# Patient Record
Sex: Male | Born: 1955 | Race: White | Hispanic: No | Marital: Single | Smoking: Former smoker
Health system: Southern US, Community
[De-identification: ages and names within clinical notes are randomized; demographics above are authoritative.]

## PROBLEM LIST (undated history)

## (undated) DIAGNOSIS — M1990 Unspecified osteoarthritis, unspecified site: Secondary | ICD-10-CM

## (undated) DIAGNOSIS — F32A Depression, unspecified: Secondary | ICD-10-CM

## (undated) DIAGNOSIS — C787 Secondary malignant neoplasm of liver and intrahepatic bile duct: Secondary | ICD-10-CM

## (undated) DIAGNOSIS — M720 Palmar fascial fibromatosis [Dupuytren]: Secondary | ICD-10-CM

## (undated) DIAGNOSIS — T7840XA Allergy, unspecified, initial encounter: Secondary | ICD-10-CM

## (undated) DIAGNOSIS — C61 Malignant neoplasm of prostate: Secondary | ICD-10-CM

## (undated) DIAGNOSIS — K219 Gastro-esophageal reflux disease without esophagitis: Secondary | ICD-10-CM

## (undated) HISTORY — DX: Depression, unspecified: F32.A

## (undated) HISTORY — DX: Palmar fascial fibromatosis (dupuytren): M72.0

## (undated) HISTORY — DX: Malignant neoplasm of prostate: C61

## (undated) HISTORY — DX: Gastro-esophageal reflux disease without esophagitis: K21.9

## (undated) HISTORY — DX: Allergy, unspecified, initial encounter: T78.40XA

## (undated) HISTORY — DX: Unspecified osteoarthritis, unspecified site: M19.90

## (undated) HISTORY — DX: Secondary malignant neoplasm of liver and intrahepatic bile duct: C78.7

## (undated) MED FILL — Dexamethasone Sodium Phosphate Inj 100 MG/10ML: INTRAMUSCULAR | Qty: 1 | Status: AC

## (undated) MED FILL — Docetaxel Soln for IV Infusion 160 MG/16ML: INTRAVENOUS | Qty: 13 | Status: AC

---
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
## 2017-12-08 HISTORY — PX: PROSTATECTOMY: SHX69

## 2018-05-31 DIAGNOSIS — F17211 Nicotine dependence, cigarettes, in remission: Secondary | ICD-10-CM | POA: Diagnosis not present

## 2018-05-31 DIAGNOSIS — C61 Malignant neoplasm of prostate: Secondary | ICD-10-CM

## 2019-06-07 ENCOUNTER — Other Ambulatory Visit (HOSPITAL_COMMUNITY): Payer: Self-pay | Admitting: Unknown Physician Specialty

## 2019-06-07 ENCOUNTER — Other Ambulatory Visit (HOSPITAL_COMMUNITY): Payer: Self-pay | Admitting: Radiation Oncology

## 2019-06-07 DIAGNOSIS — C61 Malignant neoplasm of prostate: Secondary | ICD-10-CM

## 2019-06-14 ENCOUNTER — Encounter (HOSPITAL_COMMUNITY): Payer: Medicaid Other

## 2019-06-14 ENCOUNTER — Encounter (HOSPITAL_COMMUNITY): Admission: RE | Admit: 2019-06-14 | Payer: Medicaid Other | Source: Ambulatory Visit

## 2019-06-16 ENCOUNTER — Other Ambulatory Visit (HOSPITAL_COMMUNITY): Payer: Self-pay | Admitting: Diagnostic Radiology

## 2019-06-21 ENCOUNTER — Other Ambulatory Visit: Payer: Self-pay

## 2019-06-21 ENCOUNTER — Other Ambulatory Visit (HOSPITAL_COMMUNITY): Payer: Medicaid Other

## 2019-06-21 ENCOUNTER — Ambulatory Visit (HOSPITAL_COMMUNITY)
Admission: RE | Admit: 2019-06-21 | Discharge: 2019-06-21 | Disposition: A | Discharge status: Home / Self Care | Payer: Medicaid Other | Source: Ambulatory Visit | Attending: Radiation Oncology | Admitting: Radiation Oncology

## 2019-06-21 DIAGNOSIS — C61 Malignant neoplasm of prostate: Secondary | ICD-10-CM | POA: Diagnosis not present

## 2019-06-21 MED ORDER — AXUMIN (FLUCICLOVINE F 18) INJECTION
10.0800 | Freq: Once | INTRAVENOUS | Status: AC | PRN
  Administered 2019-06-21: 15:00:00 10.08 via INTRAVENOUS

## 2019-08-25 DIAGNOSIS — M79641 Pain in right hand: Secondary | ICD-10-CM | POA: Insufficient documentation

## 2019-08-25 DIAGNOSIS — M79642 Pain in left hand: Secondary | ICD-10-CM | POA: Insufficient documentation

## 2020-03-08 HISTORY — PX: HERNIA REPAIR: SHX51

## 2020-04-11 DIAGNOSIS — C61 Malignant neoplasm of prostate: Secondary | ICD-10-CM

## 2020-05-21 DIAGNOSIS — C61 Malignant neoplasm of prostate: Secondary | ICD-10-CM | POA: Diagnosis not present

## 2020-06-20 DIAGNOSIS — C61 Malignant neoplasm of prostate: Secondary | ICD-10-CM | POA: Diagnosis not present

## 2020-08-01 DIAGNOSIS — C61 Malignant neoplasm of prostate: Secondary | ICD-10-CM

## 2020-08-22 DIAGNOSIS — C787 Secondary malignant neoplasm of liver and intrahepatic bile duct: Secondary | ICD-10-CM | POA: Diagnosis not present

## 2020-08-22 DIAGNOSIS — C61 Malignant neoplasm of prostate: Secondary | ICD-10-CM

## 2020-08-26 ENCOUNTER — Encounter: Payer: Self-pay | Admitting: Oncology

## 2020-08-26 DIAGNOSIS — C61 Malignant neoplasm of prostate: Secondary | ICD-10-CM | POA: Insufficient documentation

## 2020-08-31 ENCOUNTER — Encounter: Payer: Self-pay | Admitting: Pharmacist

## 2020-08-31 DIAGNOSIS — C61 Malignant neoplasm of prostate: Secondary | ICD-10-CM

## 2020-09-11 ENCOUNTER — Other Ambulatory Visit: Payer: Self-pay | Admitting: Hematology and Oncology

## 2020-09-11 DIAGNOSIS — C61 Malignant neoplasm of prostate: Secondary | ICD-10-CM | POA: Diagnosis not present

## 2020-09-11 DIAGNOSIS — C787 Secondary malignant neoplasm of liver and intrahepatic bile duct: Secondary | ICD-10-CM

## 2020-09-11 LAB — BASIC METABOLIC PANEL
BUN: 13 (ref 4–21)
CO2: 25 — AB (ref 13–22)
Chloride: 106 (ref 99–108)
Creatinine: 0.8 (ref 0.6–1.3)
Glucose: 89
Potassium: 3.6 (ref 3.4–5.3)
Sodium: 140 (ref 137–147)

## 2020-09-11 LAB — HEPATIC FUNCTION PANEL
ALT: 22 (ref 10–40)
AST: 31 (ref 14–40)
Alkaline Phosphatase: 45 (ref 25–125)
Bilirubin, Total: 0.6

## 2020-09-11 LAB — COMPREHENSIVE METABOLIC PANEL
Albumin: 4.1 (ref 3.5–5.0)
Calcium: 8.6 — AB (ref 8.7–10.7)

## 2020-09-11 LAB — CBC AND DIFFERENTIAL
HCT: 33 — AB (ref 41–53)
Hemoglobin: 11.1 — AB (ref 13.5–17.5)
Neutrophils Absolute: 2
Platelets: 230 (ref 150–399)
WBC: 3.6

## 2020-09-11 LAB — CBC: RBC: 3.34 — AB (ref 3.87–5.11)

## 2020-09-12 ENCOUNTER — Inpatient Hospital Stay: Payer: Medicaid Other | Attending: Oncology

## 2020-09-12 ENCOUNTER — Other Ambulatory Visit: Payer: Self-pay

## 2020-09-12 VITALS — BP 137/95 | HR 72 | Temp 97.8°F | Resp 16 | Ht 67.0 in | Wt 157.2 lb

## 2020-09-12 DIAGNOSIS — Z5111 Encounter for antineoplastic chemotherapy: Secondary | ICD-10-CM | POA: Diagnosis not present

## 2020-09-12 DIAGNOSIS — Z5189 Encounter for other specified aftercare: Secondary | ICD-10-CM | POA: Diagnosis not present

## 2020-09-12 DIAGNOSIS — C787 Secondary malignant neoplasm of liver and intrahepatic bile duct: Secondary | ICD-10-CM | POA: Insufficient documentation

## 2020-09-12 DIAGNOSIS — C61 Malignant neoplasm of prostate: Secondary | ICD-10-CM | POA: Diagnosis present

## 2020-09-12 MED ORDER — ONDANSETRON HCL 4 MG/2ML IJ SOLN
8.0000 mg | Freq: Once | INTRAMUSCULAR | Status: AC
  Administered 2020-09-12: 8 mg via INTRAVENOUS

## 2020-09-12 MED ORDER — SODIUM CHLORIDE 0.9% FLUSH
10.0000 mL | INTRAVENOUS | Status: DC | PRN
  Administered 2020-09-12 (×2): 10 mL
  Filled 2020-09-12: qty 10

## 2020-09-12 MED ORDER — SODIUM CHLORIDE 0.9 % IV SOLN
75.0000 mg/m2 | Freq: Once | INTRAVENOUS | Status: AC
  Administered 2020-09-12: 130 mg via INTRAVENOUS
  Filled 2020-09-12: qty 13

## 2020-09-12 MED ORDER — HEPARIN SOD (PORK) LOCK FLUSH 100 UNIT/ML IV SOLN
500.0000 [IU] | Freq: Once | INTRAVENOUS | Status: AC | PRN
  Administered 2020-09-12: 500 [IU]
  Filled 2020-09-12: qty 5

## 2020-09-12 MED ORDER — SODIUM CHLORIDE 0.9 % IV SOLN
Freq: Once | INTRAVENOUS | Status: AC
  Filled 2020-09-12: qty 250

## 2020-09-12 MED ORDER — ONDANSETRON HCL 4 MG/2ML IJ SOLN
INTRAMUSCULAR | Status: AC
  Filled 2020-09-12: qty 4

## 2020-09-12 MED ORDER — SODIUM CHLORIDE 0.9 % IV SOLN
10.0000 mg | Freq: Once | INTRAVENOUS | Status: AC
  Administered 2020-09-12: 10 mg via INTRAVENOUS
  Filled 2020-09-12: qty 10

## 2020-09-12 NOTE — Patient Instructions (Signed)
[facility] Discharge Instructions for Patients Receiving Chemotherapy  Today you received the following chemotherapy agents Docetaxel  To help prevent nausea and vomiting after your treatment, we encourage you to take your nausea medication.   If you develop nausea and vomiting that is not controlled by your nausea medication, call the clinic.   BELOW ARE SYMPTOMS THAT SHOULD BE REPORTED IMMEDIATELY:  *FEVER GREATER THAN 100.5 F  *CHILLS WITH OR WITHOUT FEVER  NAUSEA AND VOMITING THAT IS NOT CONTROLLED WITH YOUR NAUSEA MEDICATION  *UNUSUAL SHORTNESS OF BREATH  *UNUSUAL BRUISING OR BLEEDING  TENDERNESS IN MOUTH AND THROAT WITH OR WITHOUT PRESENCE OF ULCERS  *URINARY PROBLEMS  *BOWEL PROBLEMS  UNUSUAL RASH Items with * indicate a potential emergency and should be followed up as soon as possible.  Feel free to call the clinic should you have any questions or concerns. The clinic phone number is [phone].  Please show the [facility] at check-in to the Emergency Department and triage nurse.

## 2020-09-14 ENCOUNTER — Inpatient Hospital Stay: Payer: Medicaid Other

## 2020-09-14 ENCOUNTER — Other Ambulatory Visit: Payer: Self-pay

## 2020-09-14 VITALS — BP 139/90 | HR 80 | Temp 97.8°F | Resp 18 | Ht 67.0 in | Wt 156.3 lb

## 2020-09-14 DIAGNOSIS — C61 Malignant neoplasm of prostate: Secondary | ICD-10-CM

## 2020-09-14 DIAGNOSIS — Z5111 Encounter for antineoplastic chemotherapy: Secondary | ICD-10-CM | POA: Diagnosis not present

## 2020-09-14 MED ORDER — PEGFILGRASTIM-BMEZ 6 MG/0.6ML ~~LOC~~ SOSY
PREFILLED_SYRINGE | SUBCUTANEOUS | Status: AC
  Filled 2020-09-14: qty 0.6

## 2020-09-14 MED ORDER — PEGFILGRASTIM-BMEZ 6 MG/0.6ML ~~LOC~~ SOSY
6.0000 mg | PREFILLED_SYRINGE | Freq: Once | SUBCUTANEOUS | Status: AC
  Administered 2020-09-14: 6 mg via SUBCUTANEOUS

## 2020-09-14 NOTE — Progress Notes (Signed)
Pt stable at time of discharge. 

## 2020-10-01 DIAGNOSIS — C61 Malignant neoplasm of prostate: Secondary | ICD-10-CM | POA: Diagnosis not present

## 2020-10-01 DIAGNOSIS — C787 Secondary malignant neoplasm of liver and intrahepatic bile duct: Secondary | ICD-10-CM

## 2020-10-01 LAB — BASIC METABOLIC PANEL
BUN: 14 (ref 4–21)
CO2: 27 — AB (ref 13–22)
Chloride: 103 (ref 99–108)
Creatinine: 1 (ref 0.6–1.3)
Glucose: 121
Potassium: 4.3 (ref 3.4–5.3)
Sodium: 138 (ref 137–147)

## 2020-10-01 LAB — HEPATIC FUNCTION PANEL
ALT: 23 (ref 10–40)
AST: 32 (ref 14–40)
Alkaline Phosphatase: 51 (ref 25–125)
Bilirubin, Total: 0.5

## 2020-10-01 LAB — CBC AND DIFFERENTIAL
HCT: 35 — AB (ref 41–53)
Hemoglobin: 11.8 — AB (ref 13.5–17.5)
Neutrophils Absolute: 4.24
Platelets: 224 (ref 150–399)
WBC: 5.3

## 2020-10-01 LAB — COMPREHENSIVE METABOLIC PANEL
Albumin: 4.5 (ref 3.5–5.0)
Calcium: 9.4 (ref 8.7–10.7)

## 2020-10-01 LAB — CBC: RBC: 3.51 — AB (ref 3.87–5.11)

## 2020-10-01 LAB — PSA: PSA: 1.8

## 2020-10-02 ENCOUNTER — Other Ambulatory Visit: Payer: Self-pay | Admitting: Hematology and Oncology

## 2020-10-02 LAB — MAGNESIUM: Magnesium: 1.8

## 2020-10-03 ENCOUNTER — Other Ambulatory Visit: Payer: Self-pay

## 2020-10-03 ENCOUNTER — Inpatient Hospital Stay: Payer: Medicaid Other

## 2020-10-03 VITALS — BP 174/79 | HR 93 | Resp 18 | Ht 67.0 in | Wt 154.2 lb

## 2020-10-03 DIAGNOSIS — C61 Malignant neoplasm of prostate: Secondary | ICD-10-CM

## 2020-10-03 DIAGNOSIS — Z5111 Encounter for antineoplastic chemotherapy: Secondary | ICD-10-CM | POA: Diagnosis not present

## 2020-10-03 MED ORDER — SODIUM CHLORIDE 0.9 % IV SOLN
10.0000 mg | Freq: Once | INTRAVENOUS | Status: AC
  Administered 2020-10-03: 10 mg via INTRAVENOUS
  Filled 2020-10-03: qty 10

## 2020-10-03 MED ORDER — SODIUM CHLORIDE 0.9 % IV SOLN
Freq: Once | INTRAVENOUS | Status: AC
  Filled 2020-10-03: qty 250

## 2020-10-03 MED ORDER — SODIUM CHLORIDE 0.9 % IV SOLN
75.0000 mg/m2 | Freq: Once | INTRAVENOUS | Status: AC
  Administered 2020-10-03: 130 mg via INTRAVENOUS
  Filled 2020-10-03: qty 13

## 2020-10-03 MED ORDER — ONDANSETRON HCL 4 MG/2ML IJ SOLN
8.0000 mg | Freq: Once | INTRAMUSCULAR | Status: AC
  Administered 2020-10-03: 8 mg via INTRAVENOUS

## 2020-10-03 MED ORDER — SODIUM CHLORIDE 0.9% FLUSH
10.0000 mL | INTRAVENOUS | Status: DC | PRN
  Administered 2020-10-03: 10 mL
  Filled 2020-10-03: qty 10

## 2020-10-03 MED ORDER — HEPARIN SOD (PORK) LOCK FLUSH 100 UNIT/ML IV SOLN
500.0000 [IU] | Freq: Once | INTRAVENOUS | Status: AC | PRN
  Administered 2020-10-03: 500 [IU]
  Filled 2020-10-03: qty 5

## 2020-10-03 MED ORDER — ONDANSETRON HCL 4 MG/2ML IJ SOLN
INTRAMUSCULAR | Status: AC
  Filled 2020-10-03: qty 4

## 2020-10-03 NOTE — Progress Notes (Signed)
PT STABLE AT TIME OF DISCHARGE 

## 2020-10-03 NOTE — Patient Instructions (Signed)
Docetaxel injection What is this medicine? DOCETAXEL (doe se TAX el) is a chemotherapy drug. It targets fast dividing cells, like cancer cells, and causes these cells to die. This medicine is used to treat many types of cancers like breast cancer, certain stomach cancers, head and neck cancer, lung cancer, and prostate cancer. This medicine may be used for other purposes; ask your health care provider or pharmacist if you have questions. COMMON BRAND NAME(S): Docefrez, Taxotere What should I tell my health care provider before I take this medicine? They need to know if you have any of these conditions:  infection (especially a virus infection such as chickenpox, cold sores, or herpes)  liver disease  low blood counts, like low white cell, platelet, or red cell counts  an unusual or allergic reaction to docetaxel, polysorbate 80, other chemotherapy agents, other medicines, foods, dyes, or preservatives  pregnant or trying to get pregnant  breast-feeding How should I use this medicine? This drug is given as an infusion into a vein. It is administered in a hospital or clinic by a specially trained health care professional. Talk to your pediatrician regarding the use of this medicine in children. Special care may be needed. Overdosage: If you think you have taken too much of this medicine contact a poison control center or emergency room at once. NOTE: This medicine is only for you. Do not share this medicine with others. What if I miss a dose? It is important not to miss your dose. Call your doctor or health care professional if you are unable to keep an appointment. What may interact with this medicine?  aprepitant  certain antibiotics like erythromycin or clarithromycin  certain antivirals for HIV or hepatitis  certain medicines for fungal infections like fluconazole, itraconazole, ketoconazole, posaconazole, or  voriconazole  cimetidine  ciprofloxacin  conivaptan  cyclosporine  dronedarone  fluvoxamine  grapefruit juice  imatinib  verapamil This list may not describe all possible interactions. Give your health care provider a list of all the medicines, herbs, non-prescription drugs, or dietary supplements you use. Also tell them if you smoke, drink alcohol, or use illegal drugs. Some items may interact with your medicine. What should I watch for while using this medicine? Your condition will be monitored carefully while you are receiving this medicine. You will need important blood work done while you are taking this medicine. Call your doctor or health care professional for advice if you get a fever, chills or sore throat, or other symptoms of a cold or flu. Do not treat yourself. This drug decreases your body's ability to fight infections. Try to avoid being around people who are sick. Some products may contain alcohol. Ask your health care professional if this medicine contains alcohol. Be sure to tell all health care professionals you are taking this medicine. Certain medicines, like metronidazole and disulfiram, can cause an unpleasant reaction when taken with alcohol. The reaction includes flushing, headache, nausea, vomiting, sweating, and increased thirst. The reaction can last from 30 minutes to several hours. You may get drowsy or dizzy. Do not drive, use machinery, or do anything that needs mental alertness until you know how this medicine affects you. Do not stand or sit up quickly, especially if you are an older patient. This reduces the risk of dizzy or fainting spells. Alcohol may interfere with the effect of this medicine. Talk to your health care professional about your risk of cancer. You may be more at risk for certain types of cancer if   you take this medicine. Do not become pregnant while taking this medicine or for 6 months after stopping it. Women should inform their doctor if  they wish to become pregnant or think they might be pregnant. There is a potential for serious side effects to an unborn child. Talk to your health care professional or pharmacist for more information. Do not breast-feed an infant while taking this medicine or for 1 week after stopping it. Males who get this medicine must use a condom during sex with females who can get pregnant. If you get a woman pregnant, the baby could have birth defects. The baby could die before they are born. You will need to continue wearing a condom for 3 months after stopping the medicine. Tell your health care provider right away if your partner becomes pregnant while you are taking this medicine. This may interfere with the ability to father a child. You should talk to your doctor or health care professional if you are concerned about your fertility. What side effects may I notice from receiving this medicine? Side effects that you should report to your doctor or health care professional as soon as possible:  allergic reactions like skin rash, itching or hives, swelling of the face, lips, or tongue  blurred vision  breathing problems  changes in vision  low blood counts - This drug may decrease the number of white blood cells, red blood cells and platelets. You may be at increased risk for infections and bleeding.  nausea and vomiting  pain, redness or irritation at site where injected  pain, tingling, numbness in the hands or feet  redness, blistering, peeling, or loosening of the skin, including inside the mouth  signs of decreased platelets or bleeding - bruising, pinpoint red spots on the skin, black, tarry stools, nosebleeds  signs of decreased red blood cells - unusually weak or tired, fainting spells, lightheadedness  signs of infection - fever or chills, cough, sore throat, pain or difficulty passing urine  swelling of the ankle, feet, hands Side effects that usually do not require medical attention  (report to your doctor or health care professional if they continue or are bothersome):  constipation  diarrhea  fingernail or toenail changes  hair loss  loss of appetite  mouth sores  muscle pain This list may not describe all possible side effects. Call your doctor for medical advice about side effects. You may report side effects to FDA at 1-800-FDA-1088. Where should I keep my medicine? This drug is given in a hospital or clinic and will not be stored at home. NOTE: This sheet is a summary. It may not cover all possible information. If you have questions about this medicine, talk to your doctor, pharmacist, or health care provider.  2020 Elsevier/Gold Standard (2019-07-21 10:19:06)  

## 2020-10-04 ENCOUNTER — Ambulatory Visit: Payer: Medicaid Other

## 2020-10-04 ENCOUNTER — Inpatient Hospital Stay: Payer: Medicaid Other

## 2020-10-05 ENCOUNTER — Inpatient Hospital Stay: Payer: Medicaid Other

## 2020-10-05 ENCOUNTER — Other Ambulatory Visit: Payer: Self-pay

## 2020-10-05 VITALS — BP 126/74 | HR 85 | Temp 98.0°F | Resp 18 | Ht 67.0 in | Wt 147.2 lb

## 2020-10-05 DIAGNOSIS — Z5111 Encounter for antineoplastic chemotherapy: Secondary | ICD-10-CM | POA: Diagnosis not present

## 2020-10-05 DIAGNOSIS — C61 Malignant neoplasm of prostate: Secondary | ICD-10-CM

## 2020-10-05 MED ORDER — PEGFILGRASTIM-BMEZ 6 MG/0.6ML ~~LOC~~ SOSY
PREFILLED_SYRINGE | SUBCUTANEOUS | Status: AC
  Filled 2020-10-05: qty 0.6

## 2020-10-05 MED ORDER — PEGFILGRASTIM-BMEZ 6 MG/0.6ML ~~LOC~~ SOSY
6.0000 mg | PREFILLED_SYRINGE | Freq: Once | SUBCUTANEOUS | Status: AC
  Administered 2020-10-05: 6 mg via SUBCUTANEOUS

## 2020-10-05 NOTE — Progress Notes (Signed)
PT STABLE AT TIME OF DISCHARGE 

## 2020-10-05 NOTE — Patient Instructions (Signed)
[facility] Discharge Instructions for Patients Receiving Chemotherapy  Today you received the following chemotherapy agents Ziextenzo  To help prevent nausea and vomiting after your treatment, we encourage you to take your nausea medication.   If you develop nausea and vomiting that is not controlled by your nausea medication, call the clinic.   BELOW ARE SYMPTOMS THAT SHOULD BE REPORTED IMMEDIATELY:  *FEVER GREATER THAN 100.5 F  *CHILLS WITH OR WITHOUT FEVER  NAUSEA AND VOMITING THAT IS NOT CONTROLLED WITH YOUR NAUSEA MEDICATION  *UNUSUAL SHORTNESS OF BREATH  *UNUSUAL BRUISING OR BLEEDING  TENDERNESS IN MOUTH AND THROAT WITH OR WITHOUT PRESENCE OF ULCERS  *URINARY PROBLEMS  *BOWEL PROBLEMS  UNUSUAL RASH Items with * indicate a potential emergency and should be followed up as soon as possible.  Feel free to call the clinic should you have any questions or concerns at The clinic phone number is [phone].  Please show the [facility] at check-in to the Emergency Department and triage nurse.

## 2020-10-09 ENCOUNTER — Telehealth: Payer: Self-pay

## 2020-10-09 NOTE — Telephone Encounter (Signed)
Pt called stating he had Neulasta injection on Friday and has been experiencing excrutiating back pain since.  Sent message to [place] pt to take pain med.  If worse than before, present to ED.

## 2020-10-18 ENCOUNTER — Other Ambulatory Visit: Payer: Self-pay | Admitting: Oncology

## 2020-10-22 ENCOUNTER — Inpatient Hospital Stay (INDEPENDENT_AMBULATORY_CARE_PROVIDER_SITE_OTHER): Payer: Medicaid Other | Admitting: Hematology and Oncology

## 2020-10-22 ENCOUNTER — Inpatient Hospital Stay: Payer: Medicaid Other | Attending: Oncology

## 2020-10-22 ENCOUNTER — Other Ambulatory Visit: Payer: Self-pay

## 2020-10-22 ENCOUNTER — Other Ambulatory Visit: Payer: Self-pay | Admitting: Hematology and Oncology

## 2020-10-22 ENCOUNTER — Encounter: Payer: Self-pay | Admitting: Hematology and Oncology

## 2020-10-22 VITALS — BP 143/70 | HR 64 | Temp 98.1°F | Resp 18 | Ht 67.0 in | Wt 149.3 lb

## 2020-10-22 DIAGNOSIS — D649 Anemia, unspecified: Secondary | ICD-10-CM | POA: Insufficient documentation

## 2020-10-22 DIAGNOSIS — Z23 Encounter for immunization: Secondary | ICD-10-CM | POA: Insufficient documentation

## 2020-10-22 DIAGNOSIS — C778 Secondary and unspecified malignant neoplasm of lymph nodes of multiple regions: Secondary | ICD-10-CM | POA: Insufficient documentation

## 2020-10-22 DIAGNOSIS — C787 Secondary malignant neoplasm of liver and intrahepatic bile duct: Secondary | ICD-10-CM

## 2020-10-22 DIAGNOSIS — Z5189 Encounter for other specified aftercare: Secondary | ICD-10-CM | POA: Diagnosis not present

## 2020-10-22 DIAGNOSIS — C61 Malignant neoplasm of prostate: Secondary | ICD-10-CM

## 2020-10-22 DIAGNOSIS — R911 Solitary pulmonary nodule: Secondary | ICD-10-CM | POA: Insufficient documentation

## 2020-10-22 DIAGNOSIS — Z5111 Encounter for antineoplastic chemotherapy: Secondary | ICD-10-CM | POA: Diagnosis present

## 2020-10-22 DIAGNOSIS — R200 Anesthesia of skin: Secondary | ICD-10-CM | POA: Insufficient documentation

## 2020-10-22 DIAGNOSIS — Z79899 Other long term (current) drug therapy: Secondary | ICD-10-CM | POA: Diagnosis not present

## 2020-10-22 LAB — CBC WITH DIFFERENTIAL (CANCER CENTER ONLY)
Abs Immature Granulocytes: 0.02 10*3/uL (ref 0.00–0.07)
Basophils Absolute: 0 10*3/uL (ref 0.0–0.1)
Basophils Relative: 1 %
Eosinophils Absolute: 0 10*3/uL (ref 0.0–0.5)
Eosinophils Relative: 0 %
HCT: 37.1 % — ABNORMAL LOW (ref 39.0–52.0)
Hemoglobin: 11.8 g/dL — ABNORMAL LOW (ref 13.0–17.0)
Immature Granulocytes: 1 %
Lymphocytes Relative: 30 %
Lymphs Abs: 1.1 10*3/uL (ref 0.7–4.0)
MCH: 33.3 pg (ref 26.0–34.0)
MCHC: 31.8 g/dL (ref 30.0–36.0)
MCV: 104.8 fL — ABNORMAL HIGH (ref 80.0–100.0)
Monocytes Absolute: 0.4 10*3/uL (ref 0.1–1.0)
Monocytes Relative: 12 %
Neutro Abs: 2.1 10*3/uL (ref 1.7–7.7)
Neutrophils Relative %: 56 %
Platelet Count: 229 10*3/uL (ref 150–400)
RBC: 3.54 MIL/uL — ABNORMAL LOW (ref 4.22–5.81)
RDW: 14.3 % (ref 11.5–15.5)
WBC Count: 3.7 10*3/uL — ABNORMAL LOW (ref 4.0–10.5)
nRBC: 0 % (ref 0.0–0.2)

## 2020-10-22 LAB — CMP (CANCER CENTER ONLY)
ALT: 23 U/L (ref 0–44)
AST: 21 U/L (ref 15–41)
Albumin: 4.7 g/dL (ref 3.5–5.0)
Alkaline Phosphatase: 44 U/L (ref 38–126)
Anion gap: 10 (ref 5–15)
BUN: 15 mg/dL (ref 8–23)
CO2: 27 mmol/L (ref 22–32)
Calcium: 9.1 mg/dL (ref 8.9–10.3)
Chloride: 104 mmol/L (ref 98–111)
Creatinine: 0.92 mg/dL (ref 0.61–1.24)
GFR, Estimated: >60 mL/min (ref >60)
Glucose, Bld: 100 mg/dL — ABNORMAL HIGH (ref 70–99)
Potassium: 3.3 mmol/L — ABNORMAL LOW (ref 3.5–5.1)
Sodium: 141 mmol/L (ref 135–145)
Total Bilirubin: 0.5 mg/dL (ref 0.3–1.2)
Total Protein: 7.4 g/dL (ref 6.5–8.1)

## 2020-10-22 NOTE — Progress Notes (Signed)
[facility]  [address] [place],  [place]  [address] [phone]  Clinic Day:  10/22/2020  Referring physician: Yvonne Kendall, NP   CHIEF COMPLAINT:  CC: Follow-up of prior to an 8th cycle of docetaxel in treatment of his metastatic prostate cancer  Current Treatment:   Docetaxel/prednisone, in addition to Lupron every 4 months.   HISTORY OF PRESENT ILLNESS:  Jeffrey Lambert is a 64 y.o. 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  He had symptoms of urinary frequency and a steadily rising PSA.  Biopsy confirmed prostatic adenocarcinoma with a Gleason 8, 4+ 4, in 100% of the biopsy submitted.  He also had cystoscopy at that time, which was benign.  He has had hematuria and and hematospermia in the past.  CT imaging in May revealed mild prostatic enlargement and a bone scan was negative for metastatic disease.  He underwent radical prostatectomy and bilateral pelvic lymphadenectomy in May 2019.  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  There was extraprostatic extension at bilateral apices and positive margins at the left posterior lateral wall.  There was also infiltration of the seminal vesicles.  The tumor volume was approximately 50% of the gland.  Bilateral lymph nodes were clear.  He had a weight loss of 80 lb in the perioperative period.  Due to his aggressive prostate cancer and family history of malignancy, he underwent testing for hereditary cancer syndromes with the [employer] myRisk Hereditary Cancer Panel.  This did not reveal any clinically significant mutation or variants of uncertain significance.  He was recommended for adjuvant radiation therapy.  He received 2720 cGy for 15 fractions and then stopped in August 2019.  By January 2020, he had 1 treatment and in May 2020 he went back on daily treatment and had the other 5220 cGy in 29 fractions for a total  cumulative dose of 7940 cGy.  PET scan in July 2020 revealed mild hypermetabolic activity with soft tissue thickening in the deep pelvis felt to be postsurgical in nature.  There was no evidence of nodal metastasis or distant metastasis.  CT abdomen and pelvis done in the emergency room August 2020 revealed two hypodense liver lesions, new compared to imaging from 2019, ranging from 16-25 mm, which were suspicious for liver metastases, but were PET negative the month prior.    He continued to follow with Dr. Saddie Lambert and remained on Lupron injections every 4 months. The PSA was up 26.1 in January.  Bicalutamide was added in March.  The PSA had decreased to 9.7 in April.  We began seeing him again in May 2021, and the PSA was stable at 9.6.  We recommended CT imaging and bone scan.  CT abdomen and pelvis on May 20th revealed interval progression of liver metastases with the largest measuring 3 cm.  Ultrasound guided biopsy from June 10th revealed metastatic carcinoma consistent with the patient's known primary prostatic cancer.  Whole body bone scan from May 20th was negative for metastatic disease, but revealed degenerative changes throughout the cervical spine, as well as within the bilateral shoulders and bilateral knees.  Due to his liver metastasis, we recommended palliative IV chemotherapy with docetaxel, in addition to prednisone 5 twice daily.  He had insomnia due to additional dexamethasone premedication prior to chemotherapy, so the afternoon dose of his dexamethasone was discontinued. He continued his hormonal therapy.  CT chest, abdomen and pelvis from September  7th revealed stable right lower lobe pulmonary nodules with no new or progressive findings.  There  Was interval decrease an size of the hepatic metastatic lesions.  The largest of these lesions previously measuring 30 mm, now measure 16 mm.  No new lesions or abdominal/pelvic lymphadenopathy was observed.  There were no findings of osseous  metastatic disease.   He was seen in the emergency department on October 11th for shortness of breath. A CT a chest at that time did not reveal any pulmonary embolism.  There was stable right lower lobe pulmonary nodules, as well as stable appearance of the known hepatic metastasis. On October 25th, his PSA had decreased to 1.8.  INTERVAL HISTORY:  Jeffrey Lambert is seen today prior to an 8th cycle of docetaxel. He continues prednisone 5 mg twice daily.  He is up to date on Lupron injection. He denies fevers or chills. He denies pain at this time, but states he does have pain associated with Neulasta. His appetite is good, but he states it decreases for several days after chemotherapy. His weight has decreased 2-1/2 pounds over last 3 weeks.  REVIEW OF SYSTEMS:  Review of Systems  Constitutional: Negative for appetite change, chills, fatigue and fever.  HENT:   Negative for mouth sores and sore throat.   Eyes: Negative for icterus.  Respiratory: Negative for chest tightness, hemoptysis and wheezing.   Cardiovascular: Negative for chest pain, leg swelling and palpitations.  Gastrointestinal: Negative for abdominal pain, blood in stool, constipation, diarrhea, nausea and vomiting.  Endocrine: Negative for hot flashes.  Genitourinary: Negative for difficulty urinating, dysuria, frequency and hematuria.   Musculoskeletal: Negative for arthralgias and myalgias.  Skin: Negative for itching, rash and wound.  Neurological: Positive for numbness (intermittent tingling of his fingers). Negative for dizziness, extremity weakness, headaches and light-headedness.  Hematological: Negative for adenopathy. Does not bruise/bleed easily.  Psychiatric/Behavioral: Negative for depression. The patient is not nervous/anxious.      VITALS:  Blood pressure (!) 143/70, pulse 64, temperature 98.1 F (36.7 C), temperature source Oral, resp. rate 18, height 5' 7" (1.702 m), weight 149 lb 5 oz (67.7 kg), SpO2 99 %.  Wt  Readings from Last 3 Encounters:  10/22/20 149 lb 5 oz (67.7 kg)  10/22/20 149 lb 5 oz (67.7 kg)  10/05/20 147 lb 4 oz (66.8 kg)    Body mass index is 23.39 kg/m.  Performance status (ECOG): 0 - Asymptomatic  PHYSICAL EXAM:  Physical Exam Vitals and nursing note reviewed.  Constitutional:      Appearance: Normal appearance. He is normal weight.  HENT:     Mouth/Throat:     Mouth: Mucous membranes are moist.     Pharynx: Oropharynx is clear.  Eyes:     General: No scleral icterus.    Conjunctiva/sclera: Conjunctivae normal.     Pupils: Pupils are equal, round, and reactive to light.  Cardiovascular:     Rate and Rhythm: Normal rate and regular rhythm.     Heart sounds: Normal heart sounds. No murmur heard.  No friction rub. No gallop.   Pulmonary:     Effort: Pulmonary effort is normal.     Breath sounds: Normal breath sounds. No wheezing, rhonchi or rales.  Abdominal:     General: Bowel sounds are normal. There is no distension.     Palpations: Abdomen is soft. There is no hepatomegaly, splenomegaly or mass.     Tenderness: There is no abdominal tenderness.  Musculoskeletal:  General: Normal range of motion.     Cervical back: Neck supple.  Skin:    General: Skin is warm and dry.     Comments: Nails are brittle with ridges.  Neurological:     General: No focal deficit present.     Mental Status: He is alert and oriented to person, place, and time.  Psychiatric:        Mood and Affect: Mood normal.        Behavior: Behavior normal.        Thought Content: Thought content normal.    Lymph nodes:   There is no cervical, clavicular, axillary or inguinal lymphadenopathy.   LABS:   CBC Latest Ref Rng & Units 10/22/2020 10/01/2020 09/11/2020  WBC 4.0 - 10.5 K/uL 3.7(L) 5.3 3.6  Hemoglobin 13.0 - 17.0 g/dL 11.8(L) 11.8(A) 11.1(A)  Hematocrit 39 - 52 % 37.1(L) 35(A) 33(A)  Platelets 150 - 400 K/uL 229 224 230   CMP Latest Ref Rng & Units 10/22/2020 10/01/2020  09/11/2020  Glucose 70 - 99 mg/dL 100(H) - -  BUN 8 - 23 mg/dL _0 Creatinine 0.61 - 1.24 mg/dL 0.92 1.0 0.8  Sodium 135 - 145 mmol/L 141 138 140  Potassium 3.5 - 5.1 mmol/L 3.3(L) 4.3 3.6  Chloride 98 - 111 mmol/L 104 103 106  CO2 22 - 32 mmol/L 27 27(A) 25(A)  Calcium 8.9 - 10.3 mg/dL 9.1 9.4 8.6(A)  Total Protein 6.5 - 8.1 g/dL 7.4 - -  Total Bilirubin 0.3 - 1.2 mg/dL 0.5 - -  Alkaline Phos 38 - 126 U/L 44 51 45  AST 15 - 41 U/L 21 32 31  ALT 0 - 44 U/L _1 No results found for: CEA1 / No results found for: CEA1 No results found for: PSA1 No results found for: GRJ071 No results found for: GRE479  No results found for: TOTALPROTELP, ALBUMINELP, A1GS, A2GS, BETS, BETA2SER, GAMS, MSPIKE, SPEI No results found for: TIBC, FERRITIN, IRONPCTSAT No results found for: LDH  STUDIES:  No results found.    HISTORY:   Past Medical History:  Diagnosis Date  . Malignant neoplasm of prostate ([place])   . 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 Secondary malignant neoplasm of liver [facility])     History reviewed. No pertinent surgical history.  History reviewed. No pertinent family history.  Social History:  reports that he has quit smoking. He has never used smokeless tobacco. He reports that he does not drink alcohol. No history on file for drug use.The patient is alone today.  Allergies: No Known Allergies  Current Medications: Current Outpatient Medications  Medication Sig Dispense Refill  . bicalutamide (CASODEX) 50 MG tablet Take 40 mg by mouth daily.    Marland Kitchen dexamethasone (DECADRON) 4 MG tablet Take 4 mg by mouth as directed.    . ondansetron (ZOFRAN) 4 MG tablet Take 4 mg by mouth every 8 (eight) hours as needed for nausea or vomiting.    Marland Kitchen oxyCODONE (OXY IR/ROXICODONE) 5 MG immediate release tablet Take 5 mg by mouth every 4 (four) hours as needed for severe pain.    . pantoprazole (PROTONIX) 40 MG tablet Take 40 mg by mouth daily.    . predniSONE  (DELTASONE) 5 MG tablet Take 5 mg by mouth 2 (two) times daily with a meal.    . prochlorperazine (COMPAZINE) 10 MG tablet Take 10 mg by mouth every 6 (six) hours as needed for nausea  or vomiting.    . traZODone (DESYREL) 50 MG tablet Take 50 mg by mouth at bedtime.     No current facility-administered medications for this visit.     ASSESSMENT & PLAN:   Assessment/Plan:  1.  Stage III prostate cancer diagnosed in April 2019 who was treated with radical prostatectomy but had positive margins and involvement of the seminal vesicles as well as a Gleason 9 score and lymphovascular invasion.  He was seen by Dr. Orlene Erm who recommended adjuvant radiation therapy, which he finally completed a total cumulative dose of 7940 cGy in September 2020.  He continues Lupron injections every 3-4 months and was placed on bicalutamide in March 2021.  His PSA from April had decreased from 26.1 to 9.7, and latest down to 2.4 in October.    2.  Liver metastases, diagnosed in May of 2021, confirmed by biopsy to be metastatic from his prostate cancer.  There are 4 lesions in total measuring from 0.6 cm to 3 cm.  He is now receiving IV chemotherapy with docetaxel and prednisone, and I do recommend that we continue with therapy, at least until his next CT scan. 3. Improving anemia, B12, folate and iron studies were normal.  He will proceed with a 7th cycle of docetaxel on Wednesday, and I do recommend that he continue with chemotherapy at this time.  We will see him back in 3 weeks with CBC, CMP and PSA prior to an 8th cycle of docetaxel.  In 6 weeks, we will see him back with CBC, CMP and CT abdomen and pelvis prior to a 9th cycle.  He verbalizes understanding of an agreement to the plan today.  He knows to call the office should any new questions or concerns arise.  The patient understands the plans discussed today and is in agreement with them.  The patient knows to contact our office if he has issues requiring  immediate clinical assessment.  Marvia Pickles, PA-C

## 2020-10-23 ENCOUNTER — Telehealth: Payer: Self-pay

## 2020-10-23 LAB — PROSTATE-SPECIFIC AG, SERUM (LABCORP): Prostate Specific Ag, Serum: 1.6 ng/mL (ref 0.0–4.0)

## 2020-10-23 NOTE — Telephone Encounter (Addendum)
-----   Message from Marvia Pickles, PA-C sent at 10/23/2020  8:10 AM EST ----- Please let him know PSA stable at 1.6. Thanks!     I spoke with Fritz Pickerel, pts partner. He will relay message to Sharon, as he is in backyard @ this time.

## 2020-10-23 NOTE — Telephone Encounter (Signed)
Message Received: Today Mosher, Thalia Bloodgood, PA-C  Dairl Ponder, RN Please let him know PSA stable at 1.6. Thanks!    Attempted call to pt, no answer.

## 2020-10-24 ENCOUNTER — Other Ambulatory Visit: Payer: Self-pay

## 2020-10-24 ENCOUNTER — Inpatient Hospital Stay: Payer: Medicaid Other

## 2020-10-24 VITALS — BP 140/78 | HR 60 | Temp 98.3°F | Resp 18 | Ht 67.0 in | Wt 150.8 lb

## 2020-10-24 DIAGNOSIS — Z23 Encounter for immunization: Secondary | ICD-10-CM

## 2020-10-24 DIAGNOSIS — Z5111 Encounter for antineoplastic chemotherapy: Secondary | ICD-10-CM | POA: Diagnosis not present

## 2020-10-24 DIAGNOSIS — C61 Malignant neoplasm of prostate: Secondary | ICD-10-CM

## 2020-10-24 MED ORDER — SODIUM CHLORIDE 0.9 % IV SOLN
Freq: Once | INTRAVENOUS | Status: AC
  Filled 2020-10-24: qty 250

## 2020-10-24 MED ORDER — INFLUENZA VAC SPLIT QUAD 0.5 ML IM SUSY
0.5000 mL | PREFILLED_SYRINGE | Freq: Once | INTRAMUSCULAR | Status: AC
  Administered 2020-10-24: 0.5 mL via INTRAMUSCULAR
  Filled 2020-10-24: qty 0.5

## 2020-10-24 MED ORDER — ONDANSETRON HCL 4 MG/2ML IJ SOLN
8.0000 mg | Freq: Once | INTRAMUSCULAR | Status: AC
  Administered 2020-10-24: 8 mg via INTRAVENOUS

## 2020-10-24 MED ORDER — HEPARIN SOD (PORK) LOCK FLUSH 100 UNIT/ML IV SOLN
500.0000 [IU] | Freq: Once | INTRAVENOUS | Status: AC | PRN
  Administered 2020-10-24: 500 [IU]
  Filled 2020-10-24: qty 5

## 2020-10-24 MED ORDER — SODIUM CHLORIDE 0.9 % IV SOLN
10.0000 mg | Freq: Once | INTRAVENOUS | Status: AC
  Administered 2020-10-24: 10 mg via INTRAVENOUS
  Filled 2020-10-24: qty 10

## 2020-10-24 MED ORDER — INFLUENZA VAC SPLIT QUAD 0.5 ML IM SUSY
PREFILLED_SYRINGE | INTRAMUSCULAR | Status: AC
  Filled 2020-10-24: qty 0.5

## 2020-10-24 MED ORDER — SODIUM CHLORIDE 0.9 % IV SOLN
75.0000 mg/m2 | Freq: Once | INTRAVENOUS | Status: AC
  Administered 2020-10-24: 130 mg via INTRAVENOUS
  Filled 2020-10-24: qty 13

## 2020-10-24 MED ORDER — ONDANSETRON HCL 4 MG/2ML IJ SOLN
INTRAMUSCULAR | Status: AC
  Filled 2020-10-24: qty 4

## 2020-10-24 NOTE — Patient Instructions (Signed)
[facility] Discharge Instructions for Patients Receiving Chemotherapy  Today you received the following chemotherapy agents DOCETAXEL  To help prevent nausea and vomiting after your treatment, we encourage you to take your nausea medication.   If you develop nausea and vomiting that is not controlled by your nausea medication, call the clinic.   BELOW ARE SYMPTOMS THAT SHOULD BE REPORTED IMMEDIATELY:  *FEVER GREATER THAN 100.5 F  *CHILLS WITH OR WITHOUT FEVER  NAUSEA AND VOMITING THAT IS NOT CONTROLLED WITH YOUR NAUSEA MEDICATION  *UNUSUAL SHORTNESS OF BREATH  *UNUSUAL BRUISING OR BLEEDING  TENDERNESS IN MOUTH AND THROAT WITH OR WITHOUT PRESENCE OF ULCERS  *URINARY PROBLEMS  *BOWEL PROBLEMS  UNUSUAL RASH Items with * indicate a potential emergency and should be followed up as soon as possible.  Feel free to call the clinic should you have any questions or concerns at The clinic phone number is [phone].  Please show the CHEMO ALERT CARD at check-in to the Emergency Department and triage nurse.   

## 2020-10-24 NOTE — Progress Notes (Signed)
PT STABLE AT TIME OF DISCHARGE 

## 2020-10-26 ENCOUNTER — Other Ambulatory Visit: Payer: Self-pay

## 2020-10-26 ENCOUNTER — Inpatient Hospital Stay: Payer: Medicaid Other

## 2020-10-26 VITALS — BP 148/84 | HR 99 | Temp 98.3°F | Resp 18 | Ht 67.0 in | Wt 150.2 lb

## 2020-10-26 DIAGNOSIS — C61 Malignant neoplasm of prostate: Secondary | ICD-10-CM

## 2020-10-26 DIAGNOSIS — Z5111 Encounter for antineoplastic chemotherapy: Secondary | ICD-10-CM | POA: Diagnosis not present

## 2020-10-26 MED ORDER — PEGFILGRASTIM-BMEZ 6 MG/0.6ML ~~LOC~~ SOSY
PREFILLED_SYRINGE | SUBCUTANEOUS | Status: AC
  Filled 2020-10-26: qty 0.6

## 2020-10-26 MED ORDER — PEGFILGRASTIM-BMEZ 6 MG/0.6ML ~~LOC~~ SOSY
6.0000 mg | PREFILLED_SYRINGE | Freq: Once | SUBCUTANEOUS | Status: AC
  Administered 2020-10-26: 6 mg via SUBCUTANEOUS

## 2020-10-26 NOTE — Progress Notes (Signed)
PT STABLE AT TIME OF DISCHARGE 

## 2020-10-26 NOTE — Patient Instructions (Signed)

## 2020-11-09 NOTE — Progress Notes (Signed)
[facility]  [address] [place],  [place]  [address] [phone]  Clinic Day:  11/12/2020  Referring physician: Reita May, NP   This document serves as a record of services personally performed by [name], MD. It was created on their behalf by [name],[name], a trained medical scribe. The creation of this record is based on the scribe's personal observations and the provider's statements to them.   CHIEF COMPLAINT:  CC: Follow-up of prior to an 8th cycle of docetaxel in treatment of his metastatic prostate cancer  Current Treatment:   Docetaxel/prednisone, in addition to Lupron every 4 months.   HISTORY OF PRESENT ILLNESS:  Jeffrey Lambert is a 64 y.o. 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  He had symptoms of urinary frequency and a steadily rising PSA.  Biopsy confirmed prostatic adenocarcinoma with a Gleason 8, 4+ 4, in 100% of the biopsy submitted.  He also had cystoscopy at that time, which was benign.  He has had hematuria and and hematospermia in the past.  CT imaging in May revealed mild prostatic enlargement and a bone scan was negative for metastatic disease.  He underwent radical prostatectomy and bilateral pelvic lymphadenectomy in May 2019.  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  There was extraprostatic extension at bilateral apices and positive margins at the left posterior lateral wall.  There was also infiltration of the seminal vesicles.  The tumor volume was approximately 50% of the gland.  Bilateral lymph nodes were clear.  He had a weight loss of 80 lb in the perioperative period.  Due to his aggressive prostate cancer and family history of malignancy, he underwent testing for hereditary cancer syndromes with the [employer] myRisk Hereditary Cancer Panel.  This did not reveal any clinically significant mutation or variants of  uncertain significance.  He was recommended for adjuvant radiation therapy.  He received 2720 cGy for 15 fractions and then stopped in August 2019.  By January 2020, he had 1 treatment and in May 2020 he went back on daily treatment and had the other 5220 cGy in 29 fractions for a total cumulative dose of 7940 cGy.  PET scan in July 2020 revealed mild hypermetabolic activity with soft tissue thickening in the deep pelvis felt to be postsurgical in nature.  There was no evidence of nodal metastasis or distant metastasis.  CT abdomen and pelvis done in the emergency room August 2020 revealed two hypodense liver lesions, new compared to imaging from 2019, ranging from 16-25 mm, which were suspicious for liver metastases, but were PET negative the month prior.    He continued to follow with Dr. Nila Nephew and remained on Lupron injections every 4 months. The PSA was up 26.1 in January.  Bicalutamide was added in March.  The PSA had decreased to 9.7 in April.  We began seeing him again in May 2021, and the PSA was stable at 9.6.  We recommended CT imaging and bone scan.  CT abdomen and pelvis on May 20th revealed interval progression of liver metastases with the largest measuring 3 cm.  Ultrasound guided biopsy from June 10th revealed metastatic carcinoma consistent with the patient's known primary prostatic cancer.  Whole body bone scan from May 20th was negative for metastatic disease, but revealed degenerative changes throughout the cervical spine, as well as within the bilateral shoulders and bilateral knees.  Due to his liver metastasis, we  recommended palliative IV chemotherapy with docetaxel, in addition to prednisone 5 twice daily.  He had insomnia due to additional dexamethasone premedication prior to chemotherapy, so the afternoon dose of his dexamethasone was discontinued. He continued his hormonal therapy.  CT chest, abdomen and pelvis from September 7th revealed stable right lower lobe pulmonary nodules with no  new or progressive findings.  There  Was interval decrease an size of the hepatic metastatic lesions.  The largest of these lesions previously measuring 30 mm, now measure 16 mm.  No new lesions or abdominal/pelvic lymphadenopathy was observed.  There were no findings of osseous metastatic disease.  On October 25th, his PSA had decreased to 1.8.  INTERVAL HISTORY:  He is here for follow up prior to a 9th cycle of docetaxel.  CT abdomen and pelvis from earlier today, December 6th revealed improvement in the hepatic metastasis with the largest lesion now measuring 1.7 cm, previously 1.9 cm.  No abdominopelvic adenopathy or evidence of extrahepatic metastatic disease was observed.  He states that he is well but does report taste changes from his chemotherapy.  He denies significant neuropathy symptoms.  His hemoglobin has mildly decreased from 11.8 to 11.5, and his white count and platelets are normal.  Chemistries are unremarkable except for a BUN of 21.  His  appetite is good, and he has gained 1 pound since his last visit.  He denies fever, chills or other signs of infection.  He denies nausea, vomiting, bowel issues, or abdominal pain.  He denies sore throat, cough, dyspnea, or chest pain.  REVIEW OF SYSTEMS:  Review of Systems  Constitutional:       Changes in taste  HENT:  Negative.   Eyes: Negative.   Respiratory: Negative.   Cardiovascular: Negative.   Gastrointestinal: Negative.   Endocrine: Negative.   Genitourinary: Negative.    Musculoskeletal: Negative.   Skin: Negative.   Neurological: Negative.   Hematological: Negative.   Psychiatric/Behavioral: Negative.   All other systems reviewed and are negative.    VITALS:  Blood pressure (!) 136/93, pulse 78, temperature 98.1 F (36.7 C), temperature source Oral, resp. rate 18, height 5' 7"  (1.702 m), weight 151 lb (68.5 kg), SpO2 98 %.  Wt Readings from Last 3 Encounters:  11/12/20 151 lb (68.5 kg)  10/26/20 150 lb 4 oz (68.2 kg)   10/24/20 150 lb 12 oz (68.4 kg)    Body mass index is 23.65 kg/m.  Performance status (ECOG): 1 - Symptomatic but completely ambulatory  PHYSICAL EXAM:  Physical Exam Constitutional:      General: He is not in acute distress.    Appearance: Normal appearance. He is normal weight.  HENT:     Head: Normocephalic and atraumatic.  Eyes:     General: No scleral icterus.    Extraocular Movements: Extraocular movements intact.     Conjunctiva/sclera: Conjunctivae normal.     Pupils: Pupils are equal, round, and reactive to light.  Cardiovascular:     Rate and Rhythm: Normal rate and regular rhythm.     Pulses: Normal pulses.     Heart sounds: Normal heart sounds. No murmur heard.  No friction rub. No gallop.   Pulmonary:     Effort: Pulmonary effort is normal. No respiratory distress.     Breath sounds: Normal breath sounds.  Abdominal:     General: Bowel sounds are normal. There is no distension.     Palpations: Abdomen is soft. There is no mass.  Tenderness: There is no abdominal tenderness.  Musculoskeletal:        General: Normal range of motion.     Cervical back: Normal range of motion and neck supple.     Right lower leg: No edema.     Left lower leg: No edema.  Lymphadenopathy:     Cervical: No cervical adenopathy.  Skin:    General: Skin is warm and dry.  Neurological:     General: No focal deficit present.     Mental Status: He is alert and oriented to person, place, and time. Mental status is at baseline.  Psychiatric:        Mood and Affect: Mood normal.        Behavior: Behavior normal.        Thought Content: Thought content normal.        Judgment: Judgment normal.    Lymph nodes:   There is no cervical, clavicular, axillary or inguinal lymphadenopathy.   LABS:   CBC Latest Ref Rng & Units 10/22/2020 10/01/2020 09/11/2020  WBC 4.0 - 10.5 K/uL 3.7(L) 5.3 3.6  Hemoglobin 13.0 - 17.0 g/dL 11.8(L) 11.8(A) 11.1(A)  Hematocrit 39 - 52 % 37.1(L) 35(A)  33(A)  Platelets 150 - 400 K/uL 229 224 230   CMP Latest Ref Rng & Units 10/22/2020 10/01/2020 09/11/2020  Glucose 70 - 99 mg/dL 100(H) - -  BUN 8 - 23 mg/dL 15 14 13   Creatinine 0.61 - 1.24 mg/dL 0.92 1.0 0.8  Sodium 135 - 145 mmol/L 141 138 140  Potassium 3.5 - 5.1 mmol/L 3.3(L) 4.3 3.6  Chloride 98 - 111 mmol/L 104 103 106  CO2 22 - 32 mmol/L 27 27(A) 25(A)  Calcium 8.9 - 10.3 mg/dL 9.1 9.4 8.6(A)  Total Protein 6.5 - 8.1 g/dL 7.4 - -  Total Bilirubin 0.3 - 1.2 mg/dL 0.5 - -  Alkaline Phos 38 - 126 U/L 44 51 45  AST 15 - 41 U/L 21 32 31  ALT 0 - 44 U/L 23 23 22       Lab Results  Component Value Date   PSA1 1.6 10/22/2020    STUDIES:  CT Abdomen Pelvis W Wo Contrast  Result Date: 11/12/2020 CLINICAL DATA:  Prostate cancer. Evaluate treatment response. Liver metastasis. EXAM: CT ABDOMEN AND PELVIS WITHOUT AND WITH CONTRAST TECHNIQUE: Multidetector CT imaging of the abdomen and pelvis was performed following the standard protocol before and following the bolus administration of intravenous contrast. CONTRAST:  193m OMNIPAQUE IOHEXOL 300 MG/ML  SOLN COMPARISON:  08/14/2020. FINDINGS: Lower chest: Clear lung bases. Normal heart size without pericardial or pleural effusion. Right coronary artery calcification. Tiny hiatal hernia. Hepatobiliary: 2 adjacent right hepatic lobe lesions are again identified. The more anterior measures 1.2 cm on 22/16 versus 1.6 cm on the prior. The more posterior measures 6 mm on the same image versus 8 mm on the prior. More inferior subcapsular right hepatic lobe lesion measures 1.7 cm on 30/16 versus 1.9 cm on the prior. A too small to characterize pericholecystic right liver lobe lesion on 43/6 may represent a cyst. Normal gallbladder, without biliary ductal dilatation. Pancreas: Normal, without mass or ductal dilatation. Spleen: Normal in size, without focal abnormality. Adrenals/Urinary Tract: Normal adrenal glands. Lower pole left renal subcentimeter cyst.  Normal right kidney. No hydronephrosis. Normal urinary bladder. Stomach/Bowel: Normal remainder of the stomach. Normal colon and terminal ileum. Normal small bowel. Vascular/Lymphatic: Aortic atherosclerosis. No abdominal adenopathy. Pelvic node dissection. No pelvic sidewall adenopathy. Reproductive: Prostatectomy, without locally  recurrent disease. Soft tissue thickening within the deep pelvis including on 70/16 has been present on multiple prior exams and could represent postoperative scarring or residual seminal vesicles. Other: Suspect prior right inguinal hernia repair. Musculoskeletal: Lumbosacral spondylosis. Prominent disc bulge at L3-4. IMPRESSION: 1. Improvement in hepatic metastasis. 2. No abdominopelvic adenopathy or evidence of extrahepatic metastatic disease. 3.  Tiny hiatal hernia. 4. Aortic atherosclerosis (ICD10-I70.0) and emphysema (ICD10-J43.9). Electronically Signed   By: Abigail Miyamoto M.D.   On: 11/12/2020 10:12      HISTORY:   Allergies:  Allergies  Allergen Reactions  . Other Other (See Comments)    Grass Pollen     Current Medications: Current Outpatient Medications  Medication Sig Dispense Refill  . bicalutamide (CASODEX) 50 MG tablet Take 40 mg by mouth daily.    Marland Kitchen dexamethasone (DECADRON) 4 MG tablet Take 4 mg by mouth as directed.    . ondansetron (ZOFRAN) 4 MG tablet Take 4 mg by mouth every 8 (eight) hours as needed for nausea or vomiting.    Marland Kitchen oxyCODONE (OXY IR/ROXICODONE) 5 MG immediate release tablet Take 5 mg by mouth every 4 (four) hours as needed for severe pain.    . pantoprazole (PROTONIX) 40 MG tablet pantoprazole 40 mg tablet,delayed release  TAKE 1 TABLET BY MOUTH TWICE DAILY WITH BREAKFAST AND EVENING MEAL    . predniSONE (DELTASONE) 5 MG tablet Take 5 mg by mouth 2 (two) times daily with a meal.    . prochlorperazine (COMPAZINE) 10 MG tablet Take 10 mg by mouth every 6 (six) hours as needed for nausea or vomiting.    . traZODone (DESYREL) 50 MG  tablet Take 50 mg by mouth at bedtime.     No current facility-administered medications for this visit.     ASSESSMENT & PLAN:   Assessment: 1.  Stage III prostate cancer diagnosed in April 2019 who was treated with radical prostatectomy but had positive margins and involvement of the seminal vesicles as well as a Gleason 9 score and lymphovascular invasion.  He was seen by Dr. Orlene Erm who recommended adjuvant radiation therapy, which he finally completed a total cumulative dose of 7940 cGy in September 2020.  He continues Lupron injections every 3-4 months and was placed on bicalutamide in March 2021.  His PSA from April had decreased from 26.1 to 9.7, and latest down to 2.4 in October.     2.  Liver metastases, diagnosed in May of 2021, confirmed by biopsy to be metastatic from his prostate cancer.  There are 4 lesions in total measuring from 0.6 cm to 3 cm.  He is now receiving IV chemotherapy with docetaxel and prednisone, and CT imaging has shown improvement.  I do recommend that we continue with therapy, at this time since he has minimal toxicity and it has been effective.  3. Improving anemia, B12, folate and iron studies were normal.  Plan: CT imaging has shown improvement in the hepatic metastasis with the largest lesion now measuring 1.7 cm, previously 1.9 cm. He will proceed with a 9th cycle of docetaxel on Wednesday, and I do recommend that he continue with chemotherapy at this time.  We will see him back in 3 weeks with CBC, CMP and PSA prior to a 10th cycle of docetaxel.  He verbalizes understanding of an agreement to the plan today.  He knows to call the office should any new questions or concerns arise.  The patient understands the plans discussed today and is in agreement with  them.  The patient knows to contact our office if he has issues requiring immediate clinical assessment.   Derwood Kaplan, MD [facility] AT [facility] [address] [place] [address] Dept: [phone] Dept Fax: [phone]   I, Rita Ohara, am acting as scribe for Derwood Kaplan, MD  I have reviewed this report as typed by the medical scribe, and it is complete and accurate.

## 2020-11-12 ENCOUNTER — Other Ambulatory Visit: Payer: Self-pay | Admitting: Hematology and Oncology

## 2020-11-12 ENCOUNTER — Other Ambulatory Visit: Payer: Self-pay

## 2020-11-12 ENCOUNTER — Other Ambulatory Visit: Payer: Self-pay | Admitting: Oncology

## 2020-11-12 ENCOUNTER — Inpatient Hospital Stay: Payer: Medicaid Other | Attending: Oncology | Admitting: Oncology

## 2020-11-12 ENCOUNTER — Inpatient Hospital Stay: Payer: Medicaid Other

## 2020-11-12 ENCOUNTER — Encounter: Payer: Self-pay | Admitting: Oncology

## 2020-11-12 ENCOUNTER — Ambulatory Visit (HOSPITAL_COMMUNITY)
Admission: RE | Admit: 2020-11-12 | Discharge: 2020-11-12 | Disposition: A | Discharge status: Home / Self Care | Payer: Medicaid Other | Source: Ambulatory Visit | Attending: Hematology and Oncology | Admitting: Hematology and Oncology

## 2020-11-12 ENCOUNTER — Telehealth: Payer: Self-pay | Admitting: Oncology

## 2020-11-12 VITALS — BP 136/93 | HR 78 | Temp 98.1°F | Resp 18 | Ht 67.0 in | Wt 151.0 lb

## 2020-11-12 DIAGNOSIS — C61 Malignant neoplasm of prostate: Secondary | ICD-10-CM

## 2020-11-12 DIAGNOSIS — Z79899 Other long term (current) drug therapy: Secondary | ICD-10-CM | POA: Insufficient documentation

## 2020-11-12 DIAGNOSIS — D630 Anemia in neoplastic disease: Secondary | ICD-10-CM

## 2020-11-12 DIAGNOSIS — C787 Secondary malignant neoplasm of liver and intrahepatic bile duct: Secondary | ICD-10-CM

## 2020-11-12 DIAGNOSIS — I700 Atherosclerosis of aorta: Secondary | ICD-10-CM | POA: Insufficient documentation

## 2020-11-12 DIAGNOSIS — M47817 Spondylosis without myelopathy or radiculopathy, lumbosacral region: Secondary | ICD-10-CM | POA: Insufficient documentation

## 2020-11-12 DIAGNOSIS — T380X5A Adverse effect of glucocorticoids and synthetic analogues, initial encounter: Secondary | ICD-10-CM | POA: Insufficient documentation

## 2020-11-12 DIAGNOSIS — G720 Drug-induced myopathy: Secondary | ICD-10-CM | POA: Insufficient documentation

## 2020-11-12 DIAGNOSIS — J439 Emphysema, unspecified: Secondary | ICD-10-CM | POA: Insufficient documentation

## 2020-11-12 DIAGNOSIS — Z5111 Encounter for antineoplastic chemotherapy: Secondary | ICD-10-CM | POA: Insufficient documentation

## 2020-11-12 DIAGNOSIS — D649 Anemia, unspecified: Secondary | ICD-10-CM | POA: Insufficient documentation

## 2020-11-12 DIAGNOSIS — M6281 Muscle weakness (generalized): Secondary | ICD-10-CM | POA: Insufficient documentation

## 2020-11-12 DIAGNOSIS — Z5189 Encounter for other specified aftercare: Secondary | ICD-10-CM | POA: Insufficient documentation

## 2020-11-12 LAB — BASIC METABOLIC PANEL
BUN: 21 (ref 4–21)
CO2: 25 — AB (ref 13–22)
Chloride: 105 (ref 99–108)
Creatinine: 0.9 (ref 0.6–1.3)
Glucose: 126
Potassium: 4.4 (ref 3.4–5.3)
Sodium: 139 (ref 137–147)

## 2020-11-12 LAB — CBC AND DIFFERENTIAL
HCT: 34 — AB (ref 41–53)
Hemoglobin: 11.5 — AB (ref 13.5–17.5)
Neutrophils Absolute: 4.6
Platelets: 237 (ref 150–399)
WBC: 5.9

## 2020-11-12 LAB — HEPATIC FUNCTION PANEL
ALT: 22 (ref 10–40)
AST: 31 (ref 14–40)
Alkaline Phosphatase: 46 (ref 25–125)
Bilirubin, Total: 0.6

## 2020-11-12 LAB — COMPREHENSIVE METABOLIC PANEL
Albumin: 4.5 (ref 3.5–5.0)
Calcium: 9 (ref 8.7–10.7)

## 2020-11-12 LAB — CBC: RBC: 3.42 — AB (ref 3.87–5.11)

## 2020-11-12 MED ORDER — IOHEXOL 300 MG/ML  SOLN
100.0000 mL | Freq: Once | INTRAMUSCULAR | Status: AC | PRN
  Administered 2020-11-12: 100 mL via INTRAVENOUS

## 2020-11-12 NOTE — Telephone Encounter (Signed)
Per 12/6 los next appt given to patient

## 2020-11-14 ENCOUNTER — Inpatient Hospital Stay: Payer: Medicaid Other

## 2020-11-14 ENCOUNTER — Other Ambulatory Visit: Payer: Self-pay

## 2020-11-14 VITALS — BP 157/72 | HR 68 | Temp 97.6°F | Resp 18 | Ht 67.0 in | Wt 151.2 lb

## 2020-11-14 DIAGNOSIS — C787 Secondary malignant neoplasm of liver and intrahepatic bile duct: Secondary | ICD-10-CM | POA: Diagnosis not present

## 2020-11-14 DIAGNOSIS — C61 Malignant neoplasm of prostate: Secondary | ICD-10-CM | POA: Diagnosis present

## 2020-11-14 DIAGNOSIS — I700 Atherosclerosis of aorta: Secondary | ICD-10-CM | POA: Diagnosis not present

## 2020-11-14 DIAGNOSIS — T380X5A Adverse effect of glucocorticoids and synthetic analogues, initial encounter: Secondary | ICD-10-CM | POA: Diagnosis not present

## 2020-11-14 DIAGNOSIS — Z5189 Encounter for other specified aftercare: Secondary | ICD-10-CM | POA: Diagnosis not present

## 2020-11-14 DIAGNOSIS — Z5111 Encounter for antineoplastic chemotherapy: Secondary | ICD-10-CM | POA: Diagnosis present

## 2020-11-14 DIAGNOSIS — J439 Emphysema, unspecified: Secondary | ICD-10-CM | POA: Diagnosis not present

## 2020-11-14 DIAGNOSIS — M47817 Spondylosis without myelopathy or radiculopathy, lumbosacral region: Secondary | ICD-10-CM | POA: Diagnosis not present

## 2020-11-14 DIAGNOSIS — Z79899 Other long term (current) drug therapy: Secondary | ICD-10-CM | POA: Diagnosis not present

## 2020-11-14 DIAGNOSIS — M6281 Muscle weakness (generalized): Secondary | ICD-10-CM | POA: Diagnosis not present

## 2020-11-14 DIAGNOSIS — D649 Anemia, unspecified: Secondary | ICD-10-CM | POA: Diagnosis not present

## 2020-11-14 DIAGNOSIS — G720 Drug-induced myopathy: Secondary | ICD-10-CM | POA: Diagnosis not present

## 2020-11-14 MED ORDER — SODIUM CHLORIDE 0.9 % IV SOLN
Freq: Once | INTRAVENOUS | Status: AC
  Filled 2020-11-14: qty 250

## 2020-11-14 MED ORDER — HEPARIN SOD (PORK) LOCK FLUSH 100 UNIT/ML IV SOLN
500.0000 [IU] | Freq: Once | INTRAVENOUS | Status: AC | PRN
  Administered 2020-11-14: 500 [IU]
  Filled 2020-11-14: qty 5

## 2020-11-14 MED ORDER — ZOLEDRONIC ACID 4 MG/100ML IV SOLN
INTRAVENOUS | Status: AC
  Filled 2020-11-14: qty 200

## 2020-11-14 MED ORDER — ONDANSETRON HCL 4 MG/2ML IJ SOLN
8.0000 mg | Freq: Once | INTRAMUSCULAR | Status: AC
  Administered 2020-11-14: 8 mg via INTRAVENOUS

## 2020-11-14 MED ORDER — SODIUM CHLORIDE 0.9 % IV SOLN
75.0000 mg/m2 | Freq: Once | INTRAVENOUS | Status: AC
  Administered 2020-11-14: 130 mg via INTRAVENOUS
  Filled 2020-11-14: qty 13

## 2020-11-14 MED ORDER — SODIUM CHLORIDE 0.9 % IV SOLN
10.0000 mg | Freq: Once | INTRAVENOUS | Status: AC
  Administered 2020-11-14: 10 mg via INTRAVENOUS
  Filled 2020-11-14: qty 10

## 2020-11-14 MED ORDER — ONDANSETRON HCL 4 MG/2ML IJ SOLN
INTRAMUSCULAR | Status: AC
  Filled 2020-11-14: qty 4

## 2020-11-14 NOTE — Progress Notes (Signed)
PT STABLE AT TIME OF DISCHARGE 

## 2020-11-14 NOTE — Patient Instructions (Signed)
Docetaxel injection What is this medicine? DOCETAXEL (doe se TAX el) is a chemotherapy drug. It targets fast dividing cells, like cancer cells, and causes these cells to die. This medicine is used to treat many types of cancers like breast cancer, certain stomach cancers, head and neck cancer, lung cancer, and prostate cancer. This medicine may be used for other purposes; ask your health care provider or pharmacist if you have questions. COMMON BRAND NAME(S): Docefrez, Taxotere What should I tell my health care provider before I take this medicine? They need to know if you have any of these conditions:  infection (especially a virus infection such as chickenpox, cold sores, or herpes)  liver disease  low blood counts, like low white cell, platelet, or red cell counts  an unusual or allergic reaction to docetaxel, polysorbate 80, other chemotherapy agents, other medicines, foods, dyes, or preservatives  pregnant or trying to get pregnant  breast-feeding How should I use this medicine? This drug is given as an infusion into a vein. It is administered in a hospital or clinic by a specially trained health care professional. Talk to your pediatrician regarding the use of this medicine in children. Special care may be needed. Overdosage: If you think you have taken too much of this medicine contact a poison control center or emergency room at once. NOTE: This medicine is only for you. Do not share this medicine with others. What if I miss a dose? It is important not to miss your dose. Call your doctor or health care professional if you are unable to keep an appointment. What may interact with this medicine?  aprepitant  certain antibiotics like erythromycin or clarithromycin  certain antivirals for HIV or hepatitis  certain medicines for fungal infections like fluconazole, itraconazole, ketoconazole, posaconazole, or  voriconazole  cimetidine  ciprofloxacin  conivaptan  cyclosporine  dronedarone  fluvoxamine  grapefruit juice  imatinib  verapamil This list may not describe all possible interactions. Give your health care provider a list of all the medicines, herbs, non-prescription drugs, or dietary supplements you use. Also tell them if you smoke, drink alcohol, or use illegal drugs. Some items may interact with your medicine. What should I watch for while using this medicine? Your condition will be monitored carefully while you are receiving this medicine. You will need important blood work done while you are taking this medicine. Call your doctor or health care professional for advice if you get a fever, chills or sore throat, or other symptoms of a cold or flu. Do not treat yourself. This drug decreases your body's ability to fight infections. Try to avoid being around people who are sick. Some products may contain alcohol. Ask your health care professional if this medicine contains alcohol. Be sure to tell all health care professionals you are taking this medicine. Certain medicines, like metronidazole and disulfiram, can cause an unpleasant reaction when taken with alcohol. The reaction includes flushing, headache, nausea, vomiting, sweating, and increased thirst. The reaction can last from 30 minutes to several hours. You may get drowsy or dizzy. Do not drive, use machinery, or do anything that needs mental alertness until you know how this medicine affects you. Do not stand or sit up quickly, especially if you are an older patient. This reduces the risk of dizzy or fainting spells. Alcohol may interfere with the effect of this medicine. Talk to your health care professional about your risk of cancer. You may be more at risk for certain types of cancer if   you take this medicine. Do not become pregnant while taking this medicine or for 6 months after stopping it. Women should inform their doctor if  they wish to become pregnant or think they might be pregnant. There is a potential for serious side effects to an unborn child. Talk to your health care professional or pharmacist for more information. Do not breast-feed an infant while taking this medicine or for 1 week after stopping it. Males who get this medicine must use a condom during sex with females who can get pregnant. If you get a woman pregnant, the baby could have birth defects. The baby could die before they are born. You will need to continue wearing a condom for 3 months after stopping the medicine. Tell your health care provider right away if your partner becomes pregnant while you are taking this medicine. This may interfere with the ability to father a child. You should talk to your doctor or health care professional if you are concerned about your fertility. What side effects may I notice from receiving this medicine? Side effects that you should report to your doctor or health care professional as soon as possible:  allergic reactions like skin rash, itching or hives, swelling of the face, lips, or tongue  blurred vision  breathing problems  changes in vision  low blood counts - This drug may decrease the number of white blood cells, red blood cells and platelets. You may be at increased risk for infections and bleeding.  nausea and vomiting  pain, redness or irritation at site where injected  pain, tingling, numbness in the hands or feet  redness, blistering, peeling, or loosening of the skin, including inside the mouth  signs of decreased platelets or bleeding - bruising, pinpoint red spots on the skin, black, tarry stools, nosebleeds  signs of decreased red blood cells - unusually weak or tired, fainting spells, lightheadedness  signs of infection - fever or chills, cough, sore throat, pain or difficulty passing urine  swelling of the ankle, feet, hands Side effects that usually do not require medical attention  (report to your doctor or health care professional if they continue or are bothersome):  constipation  diarrhea  fingernail or toenail changes  hair loss  loss of appetite  mouth sores  muscle pain This list may not describe all possible side effects. Call your doctor for medical advice about side effects. You may report side effects to FDA at 1-800-FDA-1088. Where should I keep my medicine? This drug is given in a hospital or clinic and will not be stored at home. NOTE: This sheet is a summary. It may not cover all possible information. If you have questions about this medicine, talk to your doctor, pharmacist, or health care provider.  2020 Elsevier/Gold Standard (2019-07-21 10:19:06)  

## 2020-11-16 ENCOUNTER — Inpatient Hospital Stay: Payer: Medicaid Other

## 2020-11-16 ENCOUNTER — Other Ambulatory Visit: Payer: Self-pay

## 2020-11-16 VITALS — BP 143/74 | HR 75 | Temp 98.1°F | Resp 18 | Ht 67.0 in | Wt 151.2 lb

## 2020-11-16 DIAGNOSIS — Z5111 Encounter for antineoplastic chemotherapy: Secondary | ICD-10-CM | POA: Diagnosis not present

## 2020-11-16 DIAGNOSIS — C61 Malignant neoplasm of prostate: Secondary | ICD-10-CM

## 2020-11-16 MED ORDER — PEGFILGRASTIM-BMEZ 6 MG/0.6ML ~~LOC~~ SOSY
6.0000 mg | PREFILLED_SYRINGE | Freq: Once | SUBCUTANEOUS | Status: AC
  Administered 2020-11-16: 6 mg via SUBCUTANEOUS

## 2020-11-16 MED ORDER — PEGFILGRASTIM-BMEZ 6 MG/0.6ML ~~LOC~~ SOSY
PREFILLED_SYRINGE | SUBCUTANEOUS | Status: AC
  Filled 2020-11-16: qty 0.6

## 2020-11-16 NOTE — Progress Notes (Signed)
PT STABLE AT TIME OF DISCHARGE 

## 2020-11-16 NOTE — Patient Instructions (Signed)

## 2020-11-26 NOTE — Progress Notes (Signed)
PT STABLE AT TIME OF DISCHARGE 

## 2020-12-03 ENCOUNTER — Other Ambulatory Visit: Payer: Self-pay | Admitting: Hematology and Oncology

## 2020-12-03 ENCOUNTER — Other Ambulatory Visit: Payer: Self-pay

## 2020-12-03 ENCOUNTER — Inpatient Hospital Stay (INDEPENDENT_AMBULATORY_CARE_PROVIDER_SITE_OTHER): Payer: Medicaid Other | Admitting: Oncology

## 2020-12-03 ENCOUNTER — Other Ambulatory Visit: Payer: Self-pay | Admitting: Oncology

## 2020-12-03 ENCOUNTER — Encounter: Payer: Self-pay | Admitting: Oncology

## 2020-12-03 ENCOUNTER — Inpatient Hospital Stay: Payer: Medicaid Other

## 2020-12-03 VITALS — BP 156/85 | HR 81 | Temp 98.1°F | Resp 18 | Ht 67.0 in | Wt 148.0 lb

## 2020-12-03 DIAGNOSIS — G722 Myopathy due to other toxic agents: Secondary | ICD-10-CM | POA: Diagnosis not present

## 2020-12-03 DIAGNOSIS — D649 Anemia, unspecified: Secondary | ICD-10-CM

## 2020-12-03 DIAGNOSIS — C61 Malignant neoplasm of prostate: Secondary | ICD-10-CM

## 2020-12-03 DIAGNOSIS — C787 Secondary malignant neoplasm of liver and intrahepatic bile duct: Secondary | ICD-10-CM

## 2020-12-03 DIAGNOSIS — Z5111 Encounter for antineoplastic chemotherapy: Secondary | ICD-10-CM | POA: Diagnosis not present

## 2020-12-03 DIAGNOSIS — Z79899 Other long term (current) drug therapy: Secondary | ICD-10-CM

## 2020-12-03 LAB — BASIC METABOLIC PANEL
BUN: 13 (ref 4–21)
CO2: 27 — AB (ref 13–22)
Chloride: 101 (ref 99–108)
Creatinine: 0.9 (ref 0.6–1.3)
Glucose: 105
Potassium: 3.7 (ref 3.4–5.3)
Sodium: 138 (ref 137–147)

## 2020-12-03 LAB — HEPATIC FUNCTION PANEL
ALT: 20 (ref 10–40)
AST: 30 (ref 14–40)
Alkaline Phosphatase: 56 (ref 25–125)
Bilirubin, Total: 0.6

## 2020-12-03 LAB — COMPREHENSIVE METABOLIC PANEL
Albumin: 4.3 (ref 3.5–5.0)
Calcium: 9.1 (ref 8.7–10.7)

## 2020-12-03 LAB — CBC: RBC: 3.63 — AB (ref 3.87–5.11)

## 2020-12-03 LAB — CBC AND DIFFERENTIAL
HCT: 36 — AB (ref 41–53)
Hemoglobin: 12.2 — AB (ref 13.5–17.5)
Neutrophils Absolute: 3.76
Platelets: 212 (ref 150–399)
WBC: 5.3

## 2020-12-03 LAB — PSA: Prostatic Specific Antigen: 0.73 ng/mL (ref 0.00–4.00)

## 2020-12-03 NOTE — Progress Notes (Signed)
[facility]  [address] [place],  [place]  [address] [phone]  Clinic Day:  12/03/2020  Referring physician: Reita May, NP   This document serves as a record of services personally performed by [name], MD. It was created on their behalf by [name],[name], a trained medical scribe. The creation of this record is based on the scribe's personal observations and the provider's statements to them.   CHIEF COMPLAINT:  CC: Follow-up of prior to an 8th cycle of docetaxel in treatment of his metastatic prostate cancer  Current Treatment:   Docetaxel/prednisone, in addition to Lupron every 4 months.   HISTORY OF PRESENT ILLNESS:  Jeffrey Lambert is a 65 y.o. 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  He had symptoms of urinary frequency and a steadily rising PSA.  Biopsy confirmed prostatic adenocarcinoma with a Gleason 8, 4+ 4, in 100% of the biopsy submitted.  He also had cystoscopy at that time, which was benign.  He has had hematuria and and hematospermia in the past.  CT imaging in May revealed mild prostatic enlargement and a bone scan was negative for metastatic disease.  He underwent radical prostatectomy and bilateral pelvic lymphadenectomy in May 2019.  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  There was extraprostatic extension at bilateral apices and positive margins at the left posterior lateral wall.  There was also infiltration of the seminal vesicles.  The tumor volume was approximately 50% of the gland.  Bilateral lymph nodes were clear.  He had a weight loss of 80 lb in the perioperative period.  Due to his aggressive prostate cancer and family history of malignancy, he underwent testing for hereditary cancer syndromes with the Myriad myRisk Hereditary Cancer Panel.  This did not reveal any clinically significant mutation or variants of  uncertain significance.  He was recommended for adjuvant radiation therapy.  He received 2720 cGy for 15 fractions and then stopped in August 2019.  By January 2020, he had 1 treatment and in May 2020 he went back on daily treatment and had the other 5220 cGy in 29 fractions for a total cumulative dose of 7940 cGy.  PET scan in July 2020 revealed mild hypermetabolic activity with soft tissue thickening in the deep pelvis felt to be postsurgical in nature.  There was no evidence of nodal metastasis or distant metastasis.  CT abdomen and pelvis done in the emergency room August 2020 revealed two hypodense liver lesions, new compared to imaging from 2019, ranging from 16-25 mm, which were suspicious for liver metastases, but were PET negative the month prior.    He continued to follow with Dr. Nila Nephew and remained on Lupron injections every 4 months. The PSA was up 26.1 in January.  Bicalutamide was added in March.  The PSA had decreased to 9.7 in April.  We began seeing him again in May 2021, and the PSA was stable at 9.6.  We recommended CT imaging and bone scan.  CT abdomen and pelvis on May 20th revealed interval progression of liver metastases with the largest measuring 3 cm.  Ultrasound guided biopsy from June 10th revealed metastatic carcinoma consistent with the patient's known primary prostatic cancer.  Whole body bone scan from May 20th was negative for metastatic disease, but revealed degenerative changes throughout the cervical spine, as well as within the bilateral shoulders and bilateral knees.  Due to his liver metastasis, we  recommended palliative IV chemotherapy with docetaxel, in addition to prednisone 5 twice daily.  He had insomnia due to additional dexamethasone premedication prior to chemotherapy, so the afternoon dose of his dexamethasone was discontinued. He continued his hormonal therapy.  CT chest, abdomen and pelvis from September 7th revealed stable right lower lobe pulmonary nodules with no  new or progressive findings.  There  Was interval decrease an size of the hepatic metastatic lesions.  The largest of these lesions previously measuring 30 mm, now measure 16 mm.  No new lesions or abdominal/pelvic lymphadenopathy was observed.  There were no findings of osseous metastatic disease.  On October 25th, his PSA had decreased to 1.8, and down to 1.6 in November.  CT abdomen and pelvis from December 6th revealed improvement in the hepatic metastasis with the largest lesion now measuring 1.7 cm, previously 1.9 cm.  No abdominopelvic adenopathy or evidence of extrahepatic metastatic disease was observed.    INTERVAL HISTORY:  He is here for follow up prior to a 10th cycle of docetaxel.  He states that he has been well but notes extremity fatigue even with mild exertion.  He describes weakness of the muscles of the proximal legs and proximal upper extremities.  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  His taste is still affected after chemotherapy.  His hemoglobin has increased from 11.5 to 12.2, and his white count and platelets are normal.  Chemistries are unremarkable.  His  appetite is good, and he has lost 3 pounds since his last visit.  He denies fever, chills or other signs of infection.  He denies nausea, vomiting, bowel issues, or abdominal pain.  He denies sore throat, cough, dyspnea, or chest pain.  REVIEW OF SYSTEMS:  Review of Systems  Constitutional: Negative.   HENT:  Negative.   Eyes: Negative.   Respiratory: Negative.   Cardiovascular: Negative.   Gastrointestinal: Negative.   Endocrine: Negative.   Genitourinary: Negative.    Musculoskeletal: Negative.   Skin: Negative.   Neurological: Negative.   Hematological: Negative.   Psychiatric/Behavioral: Negative.   All other systems reviewed and are negative.    VITALS:  Blood pressure (!) 156/85, pulse 81, temperature 98.1 F (36.7 C), temperature  source Oral, resp. rate 18, height _0  (1.702 m), weight 148 lb (67.1 kg), SpO2 98 %.  Wt Readings from Last 3 Encounters:  12/03/20 148 lb (67.1 kg)  10/01/20 152 lb 2 oz (69 kg)  10/01/20 152 lb 2 oz (69 kg)    Body mass index is 23.18 kg/m.  Performance status (ECOG): 1 - Symptomatic but completely ambulatory  PHYSICAL EXAM:  Physical Exam Constitutional:      General: He is not in acute distress.    Appearance: Normal appearance. He is normal weight.  HENT:     Head: Normocephalic and atraumatic.  Eyes:     General: No scleral icterus.    Extraocular Movements: Extraocular movements intact.     Conjunctiva/sclera: Conjunctivae normal.     Pupils: Pupils are equal, round, and reactive to light.  Cardiovascular:     Rate and Rhythm: Normal rate and regular rhythm.     Pulses: Normal pulses.     Heart sounds: Normal heart sounds. No murmur heard. No friction rub. No gallop.   Pulmonary:     Effort: Pulmonary effort is normal. No respiratory distress.     Breath sounds: Normal breath sounds.  Abdominal:  General: Bowel sounds are normal. There is no distension.     Palpations: Abdomen is soft. There is no hepatomegaly, splenomegaly or mass.     Tenderness: There is no abdominal tenderness.  Musculoskeletal:        General: Normal range of motion.     Cervical back: Normal range of motion and neck supple.     Right lower leg: No edema.     Left lower leg: No edema.  Lymphadenopathy:     Cervical: No cervical adenopathy.  Skin:    General: Skin is warm and dry.  Neurological:     General: No focal deficit present.     Mental Status: He is alert and oriented to person, place, and time. Mental status is at baseline.  Psychiatric:        Mood and Affect: Mood normal.        Behavior: Behavior normal.        Thought Content: Thought content normal.        Judgment: Judgment normal.    Lymph nodes:   There is no cervical, clavicular, axillary or inguinal  lymphadenopathy.   LABS:   CBC Latest Ref Rng & Units 11/12/2020 10/22/2020 10/01/2020  WBC - 5.9 3.7(L) 5.3  Hemoglobin 13.5 - 17.5 11.5(A) 11.8(L) 11.8(A)  Hematocrit 41 - 53 34(A) 37.1(L) 35(A)  Platelets 150 - 399 237 229 224   CMP Latest Ref Rng & Units 11/12/2020 10/22/2020 10/01/2020  Glucose 70 - 99 mg/dL - 100(H) -  BUN 4 - _0 Creatinine 0.6 - 1.3 0.9 0.92 1.0  Sodium 137 - 147 139 141 138  Potassium 3.4 - 5.3 4.4 3.3(L) 4.3  Chloride 99 - 108 105 104 103  CO2 13 - 22 25(A) 27 27(A)  Calcium 8.7 - 10.7 9.0 9.1 9.4  Total Protein 6.5 - 8.1 g/dL - 7.4 -  Total Bilirubin 0.3 - 1.2 mg/dL - 0.5 -  Alkaline Phos 25 - 125 46 44 51  AST 14 - 40 31 21 32  ALT 10 - 40 _1 Lab Results  Component Value Date   PSA1 1.6 10/22/2020    STUDIES:   No current studies  HISTORY:   Allergies:  Allergies  Allergen Reactions  . Other Other (See Comments)    Grass Pollen     Current Medications: Current Outpatient Medications  Medication Sig Dispense Refill  . bicalutamide (CASODEX) 50 MG tablet Take 40 mg by mouth daily.    Marland Kitchen dexamethasone (DECADRON) 4 MG tablet Take 4 mg by mouth as directed.    . ondansetron (ZOFRAN) 4 MG tablet Take 4 mg by mouth every 8 (eight) hours as needed for nausea or vomiting.    Marland Kitchen oxyCODONE (OXY IR/ROXICODONE) 5 MG immediate release tablet Take 5 mg by mouth every 4 (four) hours as needed for severe pain.    . pantoprazole (PROTONIX) 40 MG tablet pantoprazole 40 mg tablet,delayed release  TAKE 1 TABLET BY MOUTH TWICE DAILY WITH BREAKFAST AND EVENING MEAL    . predniSONE (DELTASONE) 5 MG tablet Take 5 mg by mouth 2 (two) times daily with a meal.    . prochlorperazine (COMPAZINE) 10 MG tablet Take 10 mg by mouth every 6 (six) hours as needed for nausea or vomiting.    . temazepam (RESTORIL) 15 MG capsule Take 15 mg by mouth at bedtime as needed for sleep.    . traZODone (DESYREL) 50 MG tablet  Take 50 mg by mouth at bedtime.      No current facility-administered medications for this visit.     ASSESSMENT & PLAN:   Assessment: 1.  Stage III prostate cancer diagnosed in April 2019 who was treated with radical prostatectomy but had positive margins and involvement of the seminal vesicles as well as a Gleason 9 score and lymphovascular invasion.  He was seen by Dr. Orlene Erm who recommended adjuvant radiation therapy, which he finally completed a total cumulative dose of 7940 cGy in September 2020.  He continues Lupron injections every 3-4 months and was placed on bicalutamide in March 2021.  His PSA from April had decreased from 26.1 to 9.7, down to 2.4 in October, and 1.6 in November.     2.  Liver metastases, diagnosed in May of 2021, confirmed by biopsy to be metastatic from his prostate cancer.  There are 4 lesions in total measuring from 0.6 cm to 3 cm.  He is now receiving IV chemotherapy with docetaxel and prednisone, and CT imaging has shown improvement in all 3 lesions and the 4th one may represent a cyst.  I do recommend that we continue with therapy, at this time since he has minimal toxicity and it has been effective.  3. Improving anemia, B12, folate and iron studies were normal.  4.  Evidence of steroid myopathy.  I advised that he decrease the prednisone to 5 mg daily.  Plan: He will proceed with a 10th cycle of docetaxel on Wednesday, and I do recommend that he continue with chemotherapy at this time.  He knows to decrease his prednisone to 5 mg daily.  We will see him back in 3 weeks with CBC and CMP prior to a 11th cycle of docetaxel.  We will plan for repeat CT imaging in late February or early March.  He verbalizes understanding of an agreement to the plan today.  He knows to call the office should any new questions or concerns arise.  The patient understands the plans discussed today and is in agreement with them.  The patient knows to contact our office if he has issues requiring immediate clinical  assessment.   Derwood Kaplan, MD [facility] [address] [place] [address] Dept: [phone] Dept Fax: [phone]   I, Rita Ohara, am acting as scribe for Derwood Kaplan, MD  I have reviewed this report as typed by the medical scribe, and it is complete and accurate.

## 2020-12-03 NOTE — Telephone Encounter (Signed)
Medication change 

## 2020-12-05 ENCOUNTER — Inpatient Hospital Stay: Payer: Medicaid Other

## 2020-12-05 ENCOUNTER — Other Ambulatory Visit: Payer: Self-pay

## 2020-12-05 VITALS — BP 132/88 | HR 88 | Temp 98.3°F | Resp 18 | Ht 67.0 in | Wt 147.0 lb

## 2020-12-05 DIAGNOSIS — Z5111 Encounter for antineoplastic chemotherapy: Secondary | ICD-10-CM | POA: Diagnosis not present

## 2020-12-05 DIAGNOSIS — C61 Malignant neoplasm of prostate: Secondary | ICD-10-CM

## 2020-12-05 MED ORDER — SODIUM CHLORIDE 0.9 % IV SOLN
Freq: Once | INTRAVENOUS | Status: AC
Start: 2020-12-05 — End: 2020-12-05
  Filled 2020-12-05: qty 250

## 2020-12-05 MED ORDER — ONDANSETRON HCL 4 MG/2ML IJ SOLN
8.0000 mg | Freq: Once | INTRAMUSCULAR | Status: AC
  Administered 2020-12-05: 8 mg via INTRAVENOUS

## 2020-12-05 MED ORDER — HEPARIN SOD (PORK) LOCK FLUSH 100 UNIT/ML IV SOLN
500.0000 [IU] | Freq: Once | INTRAVENOUS | Status: AC | PRN
  Administered 2020-12-05: 500 [IU]
  Filled 2020-12-05: qty 5

## 2020-12-05 MED ORDER — ONDANSETRON HCL 4 MG/2ML IJ SOLN
INTRAMUSCULAR | Status: AC
  Filled 2020-12-05: qty 4

## 2020-12-05 MED ORDER — DEXAMETHASONE SODIUM PHOSPHATE 100 MG/10ML IJ SOLN
10.0000 mg | Freq: Once | INTRAMUSCULAR | Status: AC
  Administered 2020-12-05: 10 mg via INTRAVENOUS
  Filled 2020-12-05: qty 10

## 2020-12-05 MED ORDER — SODIUM CHLORIDE 0.9 % IV SOLN
75.0000 mg/m2 | Freq: Once | INTRAVENOUS | Status: AC
  Administered 2020-12-05: 130 mg via INTRAVENOUS
  Filled 2020-12-05: qty 13

## 2020-12-05 NOTE — Patient Instructions (Signed)
[facility] Discharge Instructions for Patients Receiving Chemotherapy  Today you received the following chemotherapy agents docetaxel  To help prevent nausea and vomiting after your treatment, we encourage you to take your nausea medication.   If you develop nausea and vomiting that is not controlled by your nausea medication, call the clinic.   BELOW ARE SYMPTOMS THAT SHOULD BE REPORTED IMMEDIATELY:  *FEVER GREATER THAN 100.5 F  *CHILLS WITH OR WITHOUT FEVER  NAUSEA AND VOMITING THAT IS NOT CONTROLLED WITH YOUR NAUSEA MEDICATION  *UNUSUAL SHORTNESS OF BREATH  *UNUSUAL BRUISING OR BLEEDING  TENDERNESS IN MOUTH AND THROAT WITH OR WITHOUT PRESENCE OF ULCERS  *URINARY PROBLEMS  *BOWEL PROBLEMS  UNUSUAL RASH Items with * indicate a potential emergency and should be followed up as soon as possible.  Feel free to call the clinic should you have any questions or concerns at The clinic phone number is [phone].  Please show the CHEMO ALERT CARD at check-in to the Emergency Department and triage nurse.

## 2020-12-06 ENCOUNTER — Inpatient Hospital Stay: Payer: Medicaid Other

## 2020-12-06 ENCOUNTER — Other Ambulatory Visit: Payer: Self-pay | Admitting: Oncology

## 2020-12-06 ENCOUNTER — Ambulatory Visit: Payer: Medicaid Other

## 2020-12-06 VITALS — BP 138/78 | HR 81 | Temp 98.1°F | Resp 18 | Ht 67.0 in | Wt 147.0 lb

## 2020-12-06 DIAGNOSIS — C61 Malignant neoplasm of prostate: Secondary | ICD-10-CM

## 2020-12-06 DIAGNOSIS — Z5111 Encounter for antineoplastic chemotherapy: Secondary | ICD-10-CM | POA: Diagnosis not present

## 2020-12-06 MED ORDER — PEGFILGRASTIM-BMEZ 6 MG/0.6ML ~~LOC~~ SOSY
6.0000 mg | PREFILLED_SYRINGE | Freq: Once | SUBCUTANEOUS | Status: AC
  Administered 2020-12-06: 6 mg via SUBCUTANEOUS

## 2020-12-06 MED ORDER — PEGFILGRASTIM-BMEZ 6 MG/0.6ML ~~LOC~~ SOSY
PREFILLED_SYRINGE | SUBCUTANEOUS | Status: AC
  Filled 2020-12-06: qty 0.6

## 2020-12-06 NOTE — Progress Notes (Signed)
PT STABLE AT TIME OF DISCHARGE 

## 2020-12-06 NOTE — Patient Instructions (Signed)

## 2020-12-24 ENCOUNTER — Ambulatory Visit: Payer: Medicaid Other | Admitting: Hematology and Oncology

## 2020-12-24 ENCOUNTER — Other Ambulatory Visit: Payer: Self-pay | Admitting: Oncology

## 2020-12-24 ENCOUNTER — Other Ambulatory Visit: Payer: Medicaid Other

## 2020-12-24 ENCOUNTER — Inpatient Hospital Stay: Payer: Medicaid Other | Admitting: Hematology and Oncology

## 2020-12-24 ENCOUNTER — Inpatient Hospital Stay: Payer: Medicaid Other

## 2020-12-24 NOTE — Progress Notes (Deleted)
[facility]  [address] [place],  [place]  [address] [phone]  Clinic Day:  12/24/2020  Referring physician: Reita May, NP   CHIEF COMPLAINT:  CC: Metastatic prostate cancer  Current Treatment:   Docetaxel/prednisone with leuprolide every 4 months.  HISTORY OF PRESENT ILLNESS:  Jeffrey Lambert is a 65 y.o. 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  He had symptoms of urinary frequency and a steadily rising PSA.  Biopsy confirmed prostatic adenocarcinoma with a Gleason 8, 4+ 4, in 100% of the biopsy submitted.  He also had cystoscopy at that time, which was benign.  He has had hematuria and and hematospermia in the past.  CT imaging in May revealed mild prostatic enlargement and a bone scan was negative for metastatic disease.  He underwent radical prostatectomy and bilateral pelvic lymphadenectomy in May 2019.  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  There was extraprostatic extension at bilateral apices and positive margins at the left posterior lateral wall.  There was also infiltration of the seminal vesicles.  The tumor volume was approximately 50% of the gland.  Bilateral lymph nodes were clear.  He had a weight loss of 80 lb in the perioperative period.  Due to his aggressive prostate cancer and family history of malignancy, he underwent testing for hereditary cancer syndromes with the Myriad myRisk Hereditary Cancer Panel.  This did not reveal any clinically significant mutation or variants of uncertain significance.  He was recommended for adjuvant radiation therapy.  He received 2720 cGy for 15 fractions and then stopped in August 2019.  By January 2020, he had 1 treatment and in May 2020 he went back on daily treatment and had the other 5220 cGy in 29 fractions for a total cumulative dose of 7940 cGy.  PET scan in July 2020 revealed mild hypermetabolic  activity with soft tissue thickening in the deep pelvis felt to be postsurgical in nature.  There was no evidence of nodal metastasis or distant metastasis.  CT abdomen and pelvis done in the emergency room August 2020 revealed two hypodense liver lesions, new compared to imaging from 2019, ranging from 16-25 mm, which were suspicious for liver metastases, but were PET negative the month prior.    He continued to follow with Dr. Nila Nephew and remained on Lupron injections every 4 months. The PSA was up 26.1 in January.  Bicalutamide was added in March.  The PSA had decreased to 9.7 in April.  We began seeing him again in May 2021, and the PSA was stable at 9.6.  We recommended CT imaging and bone scan.  CT abdomen and pelvis on May 20th revealed interval progression of liver metastases with the largest measuring 3 cm.  Ultrasound guided biopsy from June 10th revealed metastatic carcinoma consistent with the patient's known primary prostatic cancer.  Whole body bone scan in Maywas negative for metastatic disease, but revealed degenerative changes throughout the cervical spine, as well as within the bilateral shoulders and bilateral knees.  Due to his liver metastasis, we recommended palliative IV chemotherapy with docetaxel, in addition to prednisone 5 twice daily.  He had insomnia due to additional dexamethasone premedication prior to chemotherapy, so the afternoon dose of his dexamethasone was discontinued. He continued his hormonal therapy.  CT chest, abdomen and pelvis in September revealed stable right lower lobe pulmonary nodules with no new or progressive findings.  There  Was interval  decrease an size of the hepatic metastatic lesions.  The largest of these lesions previously measuring 30 mm, now measure 16 mm.  No new lesions or abdominal/pelvic lymphadenopathy was observed.  There were no findings of osseous metastatic disease.  In October, his PSA had decreased to 1.8, and down to 1.6 in November.  CT abdomen  and pelvisin December revealed improvement in the hepatic metastasis with the largest lesion now measuring 1.7 cm, previously 1.9 cm.  No abdominopelvic adenopathy or evidence of extrahepatic metastatic disease was observed.  He has had extremity fatigue felt to be due to steroid myopathy, so prednisone was decreased to once daily. The PSA has steadily decreased and was down to 9.7 in April from 26.1.  PSA was 2.4 in October, 1.6 in November and 0.73 in December.   INTERVAL HISTORY:  He is here for follow up prior to an 11th cycle of docetaxel.  He continues prednisone daily.  He denies fevers or chills. He denies pain.  His  appetite is good.  His weight is {Weight change:10426}   REVIEW OF SYSTEMS:  Review of Systems - Oncology   VITALS:  There were no vitals taken for this visit.  Wt Readings from Last 3 Encounters:  12/06/20 147 lb (66.7 kg)  12/05/20 147 lb (66.7 kg)  12/03/20 148 lb (67.1 kg)    There is no height or weight on file to calculate BMI.  Performance status (ECOG): 1 - Symptomatic but completely ambulatory  PHYSICAL EXAM:  Physical Exam Lymph nodes:   There is no cervical, clavicular, axillary or inguinal lymphadenopathy.   LABS:   CBC Latest Ref Rng & Units 12/03/2020 11/12/2020 10/22/2020  WBC - 5.3 5.9 3.7(L)  Hemoglobin 13.5 - 17.5 12.2(A) 11.5(A) 11.8(L)  Hematocrit 41 - 53 36(A) 34(A) 37.1(L)  Platelets 150 - 399 212 237 229   CMP Latest Ref Rng & Units 12/03/2020 11/12/2020 10/22/2020  Glucose 70 - 99 mg/dL - - 100(H)  BUN 4 - 21 13 21 15   Creatinine 0.6 - 1.3 0.9 0.9 0.92  Sodium 137 - 147 138 139 141  Potassium 3.4 - 5.3 3.7 4.4 3.3(L)  Chloride 99 - 108 101 105 104  CO2 13 - 22 27(A) 25(A) 27  Calcium 8.7 - 10.7 9.1 9.0 9.1  Total Protein 6.5 - 8.1 g/dL - - 7.4  Total Bilirubin 0.3 - 1.2 mg/dL - - 0.5  Alkaline Phos 25 - 125 56 46 44  AST 14 - 40 30 31 21   ALT 10 - 40 20 22 23       Lab Results  Component Value Date   PSA1 1.6 10/22/2020     STUDIES:   No current studies  HISTORY:   Allergies:  Allergies  Allergen Reactions  . Other Other (See Comments)    Grass Pollen     Current Medications: Current Outpatient Medications  Medication Sig Dispense Refill  . bicalutamide (CASODEX) 50 MG tablet Take 40 mg by mouth daily.    Marland Kitchen dexamethasone (DECADRON) 4 MG tablet Take 4 mg by mouth as directed.    . ondansetron (ZOFRAN) 4 MG tablet Take 4 mg by mouth every 8 (eight) hours as needed for nausea or vomiting.    Marland Kitchen oxyCODONE (OXY IR/ROXICODONE) 5 MG immediate release tablet Take 5 mg by mouth every 4 (four) hours as needed for severe pain.    . pantoprazole (PROTONIX) 40 MG tablet pantoprazole 40 mg tablet,delayed release  TAKE 1 TABLET BY MOUTH TWICE DAILY  WITH BREAKFAST AND EVENING MEAL    . predniSONE (DELTASONE) 5 MG tablet Take 1 tablet by mouth twice daily 60 tablet 5  . prochlorperazine (COMPAZINE) 10 MG tablet Take 10 mg by mouth every 6 (six) hours as needed for nausea or vomiting.    . temazepam (RESTORIL) 15 MG capsule Take 15 mg by mouth at bedtime as needed for sleep.    . traZODone (DESYREL) 50 MG tablet Take 50 mg by mouth at bedtime.     No current facility-administered medications for this visit.     ASSESSMENT & PLAN:   Assessment: 1.  Stage III prostate cancer diagnosed in April 2019 who was treated with radical prostatectomy but had positive margins and involvement of the seminal vesicles as well as a Gleason 9 score and lymphovascular invasion.  He was seen by Dr. Orlene Erm who recommended adjuvant radiation therapy, which he finally completed a total cumulative dose of 7940 cGy in September 2020.  He continues Lupron injections every 3-4 months and was placed on bicalutamide in March 2021.  His PSA has steadily decreased.    2.  Liver metastases, diagnosed in May of 2021, confirmed by biopsy to be metastatic from his prostate cancer.  There are 4 lesions in total measuring from 0.6 cm to 3 cm.  He  is now receiving IV chemotherapy with docetaxel and prednisone, and CT imaging has shown improvement in all 3 lesions and the 4th one may represent a cyst.  I do recommend that we continue with therapy, at this time since he has minimal toxicity and it has been effective. 3. Anemia. B12, folate and iron studies were normal. 4. Evidence of steroid myopathy, *** with decreasing prednisone to 5 mg daily.  Plan:  He will proceed with an 11th cycle of docetaxel this week.  He knows to continue prednisone to 5 mg daily.  We will see him back in 3 weeks with CBC and comprehensive metabolic panel prior to a 12th cycle of docetaxel.  The patient understands the plans discussed today and is in agreement with them.  The patient knows to contact our office if he has issues requiring immediate clinical assessment.   Zein Helbing A. Georgann Housekeeper, [facility] [address] [place] [address] Dept: [phone] Dept Fax: [phone]

## 2020-12-25 ENCOUNTER — Other Ambulatory Visit: Payer: Self-pay | Admitting: Pharmacist

## 2020-12-26 ENCOUNTER — Inpatient Hospital Stay: Payer: Medicaid Other

## 2020-12-28 ENCOUNTER — Inpatient Hospital Stay: Payer: Medicaid Other

## 2020-12-31 ENCOUNTER — Inpatient Hospital Stay: Payer: Medicaid Other | Attending: Oncology

## 2020-12-31 ENCOUNTER — Encounter: Payer: Self-pay | Admitting: Hematology and Oncology

## 2020-12-31 ENCOUNTER — Inpatient Hospital Stay (INDEPENDENT_AMBULATORY_CARE_PROVIDER_SITE_OTHER): Payer: Medicaid Other | Admitting: Hematology and Oncology

## 2020-12-31 ENCOUNTER — Other Ambulatory Visit: Payer: Self-pay

## 2020-12-31 VITALS — BP 142/80 | HR 84 | Temp 98.6°F | Resp 18 | Ht 67.0 in | Wt 149.2 lb

## 2020-12-31 DIAGNOSIS — M47812 Spondylosis without myelopathy or radiculopathy, cervical region: Secondary | ICD-10-CM | POA: Diagnosis not present

## 2020-12-31 DIAGNOSIS — M170 Bilateral primary osteoarthritis of knee: Secondary | ICD-10-CM | POA: Insufficient documentation

## 2020-12-31 DIAGNOSIS — C787 Secondary malignant neoplasm of liver and intrahepatic bile duct: Secondary | ICD-10-CM | POA: Diagnosis not present

## 2020-12-31 DIAGNOSIS — D649 Anemia, unspecified: Secondary | ICD-10-CM

## 2020-12-31 DIAGNOSIS — C7951 Secondary malignant neoplasm of bone: Secondary | ICD-10-CM

## 2020-12-31 DIAGNOSIS — Z5111 Encounter for antineoplastic chemotherapy: Secondary | ICD-10-CM | POA: Diagnosis not present

## 2020-12-31 DIAGNOSIS — G4700 Insomnia, unspecified: Secondary | ICD-10-CM | POA: Diagnosis not present

## 2020-12-31 DIAGNOSIS — R350 Frequency of micturition: Secondary | ICD-10-CM | POA: Diagnosis not present

## 2020-12-31 DIAGNOSIS — Z7952 Long term (current) use of systemic steroids: Secondary | ICD-10-CM | POA: Diagnosis not present

## 2020-12-31 DIAGNOSIS — N401 Enlarged prostate with lower urinary tract symptoms: Secondary | ICD-10-CM | POA: Insufficient documentation

## 2020-12-31 DIAGNOSIS — Z79899 Other long term (current) drug therapy: Secondary | ICD-10-CM | POA: Diagnosis not present

## 2020-12-31 DIAGNOSIS — C61 Malignant neoplasm of prostate: Secondary | ICD-10-CM

## 2020-12-31 DIAGNOSIS — Z9079 Acquired absence of other genital organ(s): Secondary | ICD-10-CM | POA: Insufficient documentation

## 2020-12-31 DIAGNOSIS — R634 Abnormal weight loss: Secondary | ICD-10-CM | POA: Diagnosis not present

## 2020-12-31 DIAGNOSIS — R918 Other nonspecific abnormal finding of lung field: Secondary | ICD-10-CM | POA: Insufficient documentation

## 2020-12-31 DIAGNOSIS — G720 Drug-induced myopathy: Secondary | ICD-10-CM

## 2020-12-31 DIAGNOSIS — E538 Deficiency of other specified B group vitamins: Secondary | ICD-10-CM

## 2020-12-31 DIAGNOSIS — Z5189 Encounter for other specified aftercare: Secondary | ICD-10-CM | POA: Diagnosis not present

## 2020-12-31 LAB — COMPREHENSIVE METABOLIC PANEL
Albumin: 4.3 (ref 3.5–5.0)
Calcium: 9.1 (ref 8.7–10.7)

## 2020-12-31 LAB — BASIC METABOLIC PANEL
BUN: 14 (ref 4–21)
CO2: 26 — AB (ref 13–22)
Chloride: 104 (ref 99–108)
Creatinine: 1 (ref 0.6–1.3)
Glucose: 104
Potassium: 4.1 (ref 3.4–5.3)
Sodium: 139 (ref 137–147)

## 2020-12-31 LAB — CBC AND DIFFERENTIAL
HCT: 37 — AB (ref 41–53)
Hemoglobin: 12.4 — AB (ref 13.5–17.5)
Neutrophils Absolute: 4.81
Platelets: 339 (ref 150–399)
WBC: 6.5

## 2020-12-31 LAB — PSA: Prostatic Specific Antigen: 0.51 ng/mL (ref 0.00–4.00)

## 2020-12-31 LAB — HEPATIC FUNCTION PANEL
ALT: 27 (ref 10–40)
AST: 34 (ref 14–40)
Alkaline Phosphatase: 51 (ref 25–125)
Bilirubin, Total: 0.6

## 2020-12-31 LAB — CBC: RBC: 3.72 — AB (ref 3.87–5.11)

## 2020-12-31 NOTE — Progress Notes (Signed)
Jeffrey Lambert  [address] [place],  [place]  [address] [phone]  Clinic Day:  12/31/2020  Referring physician: Reita May, NP     CHIEF COMPLAINT:  CC: Follow-up of prior to an 8th cycle of docetaxel in treatment of his metastatic prostate cancer  Current Treatment:   Docetaxel/prednisone, in addition to Lupron every 4 months.   HISTORY OF PRESENT ILLNESS:  Jeffrey Lambert is a 65 y.o. 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  He had symptoms of urinary frequency and a steadily rising PSA.  Biopsy confirmed prostatic adenocarcinoma with a Gleason 8, 4+ 4, in 100% of the biopsy submitted.  He also had cystoscopy at that time, which was benign.  He has had hematuria and and hematospermia in the past.  CT imaging in May revealed mild prostatic enlargement and a bone scan was negative for metastatic disease.  He underwent radical prostatectomy and bilateral pelvic lymphadenectomy in May 2019.  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  There was extraprostatic extension at bilateral apices and positive margins at the left posterior lateral wall.  There was also infiltration of the seminal vesicles.  The tumor volume was approximately 50% of the gland.  Bilateral lymph nodes were clear.  He had a weight loss of 80 lb in the perioperative period.  Due to his aggressive prostate cancer and family history of malignancy, he underwent testing for hereditary cancer syndromes with the [employer] myRisk Hereditary Cancer Panel.  This did not reveal any clinically significant mutation or variants of uncertain significance.  He was recommended for adjuvant radiation therapy.  He received 2720 cGy for 15 fractions and then stopped in August 2019.  By January 2020, he had 1 treatment and in May 2020 he went back on daily treatment and had the other 5220 cGy in 29 fractions for a total  cumulative dose of 7940 cGy.  PET scan in July 2020 revealed mild hypermetabolic activity with soft tissue thickening in the deep pelvis felt to be postsurgical in nature.  There was no evidence of nodal metastasis or distant metastasis.  CT abdomen and pelvis done in the emergency room August 2020 revealed two hypodense liver lesions, new compared to imaging from 2019, ranging from 16-25 mm, which were suspicious for liver metastases, but were PET negative the month prior.    He continued to follow with Dr. Nila Nephew and remained on Lupron injections every 4 months. The PSA was up 26.1 in January.  Bicalutamide was added in March.  The PSA had decreased to 9.7 in April.  We began seeing him again in May 2021, and the PSA was stable at 9.6.  We recommended CT imaging and bone scan.  CT abdomen and pelvis on May 20th revealed interval progression of liver metastases with the largest measuring 3 cm.  Ultrasound guided biopsy from June 10th revealed metastatic carcinoma consistent with the patient's known primary prostatic cancer.  Whole body bone scan from May 20th was negative for metastatic disease, but revealed degenerative changes throughout the cervical spine, as well as within the bilateral shoulders and bilateral knees.  Due to his liver metastasis, we recommended palliative IV chemotherapy with docetaxel, in addition to prednisone 5 twice daily.  He had insomnia due to additional dexamethasone premedication prior to chemotherapy, so the afternoon dose of his dexamethasone was discontinued. He continued his hormonal therapy.  CT chest, abdomen and pelvis  from September 7th revealed stable right lower lobe pulmonary nodules with no new or progressive findings.  There  Was interval decrease an size of the hepatic metastatic lesions.  The largest of these lesions previously measuring 30 mm, now measure 16 mm.  No new lesions or abdominal/pelvic lymphadenopathy was observed.  There were no findings of osseous  metastatic disease.  On October 25th, his PSA had decreased to 1.8, and down to 1.6 in November.  CT abdomen and pelvis from December 6th revealed improvement in the hepatic metastasis with the largest lesion now measuring 1.7 cm, previously 1.9 cm.  No abdominopelvic adenopathy or evidence of extrahepatic metastatic disease was observed.    INTERVAL HISTORY:  He is here for follow up prior to a 11th cycle of docetaxel. He had been experiencing fatigue along with muscle weakness most likely related to his steroids. We did decrease his dose to prednisone 5 mg daily and reports improvement. He is sleeping better since switching to temazepam for insomnia. He denies fever, chills, nausea or vomiting. He denies shortness of breath, cough or chest pain. He denies issue with bowel or bladder. His appetite is good and he has gained one pound since last visit. CBC and CMP are unremarkable.   REVIEW OF SYSTEMS:  Review of Systems  Constitutional: Negative for appetite change, chills, diaphoresis, fatigue, fever and unexpected weight change.  HENT:   Negative for hearing loss, lump/mass, mouth sores, nosebleeds, sore throat, tinnitus, trouble swallowing and voice change.   Eyes: Negative for eye problems and icterus.  Respiratory: Negative for chest tightness, cough, hemoptysis, shortness of breath and wheezing.   Cardiovascular: Negative for chest pain, leg swelling and palpitations.  Gastrointestinal: Negative for abdominal distention, abdominal pain, blood in stool, constipation, diarrhea, nausea, rectal pain and vomiting.  Endocrine: Negative for hot flashes.  Genitourinary: Negative for bladder incontinence, difficulty urinating, dyspareunia, dysuria, frequency, hematuria and nocturia.   Musculoskeletal: Negative for arthralgias, back pain, flank pain, gait problem, myalgias, neck pain and neck stiffness.  Skin: Negative for itching, rash and wound.  Neurological: Negative for dizziness, extremity  weakness, gait problem, headaches, light-headedness, numbness, seizures and speech difficulty.  Hematological: Negative for adenopathy. Does not bruise/bleed easily.  Psychiatric/Behavioral: Negative for confusion, decreased concentration, depression, sleep disturbance and suicidal ideas. The patient is not nervous/anxious.      VITALS:  Blood pressure (!) 142/80, pulse 84, temperature 98.6 F (37 C), temperature source Oral, resp. rate 18, height _0  (1.702 m), weight 149 lb 3.2 oz (67.7 kg), SpO2 98 %.  Wt Readings from Last 3 Encounters:  12/31/20 149 lb 3.2 oz (67.7 kg)  12/06/20 147 lb (66.7 kg)  12/05/20 147 lb (66.7 kg)    Body mass index is 23.37 kg/m.  Performance status (ECOG): 1 - Symptomatic but completely ambulatory  PHYSICAL EXAM:  Physical Exam Constitutional:      General: He is not in acute distress.    Appearance: Normal appearance. He is normal weight. He is not ill-appearing, toxic-appearing or diaphoretic.  HENT:     Head: Normocephalic and atraumatic.     Right Ear: Tympanic membrane normal.     Left Ear: Tympanic membrane normal.     Nose: Nose normal. No congestion or rhinorrhea.     Mouth/Throat:     Mouth: Mucous membranes are moist.     Pharynx: Oropharynx is clear. No oropharyngeal exudate or posterior oropharyngeal erythema.  Eyes:     General: No scleral icterus.  Right eye: No discharge.        Left eye: No discharge.     Extraocular Movements: Extraocular movements intact.     Conjunctiva/sclera: Conjunctivae normal.     Pupils: Pupils are equal, round, and reactive to light.  Neck:     Vascular: No carotid bruit.  Cardiovascular:     Rate and Rhythm: Normal rate and regular rhythm.     Heart sounds: No murmur heard. No friction rub. No gallop.   Pulmonary:     Effort: Pulmonary effort is normal. No respiratory distress.     Breath sounds: Normal breath sounds. No stridor. No wheezing, rhonchi or rales.  Chest:     Chest wall: No  tenderness.  Abdominal:     General: Abdomen is flat. Bowel sounds are normal. There is no distension.     Palpations: There is no mass.     Tenderness: There is no abdominal tenderness. There is no right CVA tenderness, left CVA tenderness, guarding or rebound.     Hernia: No hernia is present.  Musculoskeletal:        General: No swelling, tenderness, deformity or signs of injury. Normal range of motion.     Cervical back: Normal range of motion and neck supple. No rigidity or tenderness.     Right lower leg: No edema.     Left lower leg: No edema.  Lymphadenopathy:     Cervical: No cervical adenopathy.  Skin:    General: Skin is warm and dry.     Capillary Refill: Capillary refill takes less than 2 seconds.     Coloration: Skin is not jaundiced or pale.     Findings: No bruising, erythema, lesion or rash.  Neurological:     General: No focal deficit present.     Mental Status: He is alert and oriented to person, place, and time. Mental status is at baseline.     Cranial Nerves: No cranial nerve deficit.     Sensory: No sensory deficit.     Motor: No weakness.     Coordination: Coordination normal.     Gait: Gait normal.     Deep Tendon Reflexes: Reflexes normal.  Psychiatric:        Mood and Affect: Mood normal.        Behavior: Behavior normal.        Thought Content: Thought content normal.        Judgment: Judgment normal.    Lymph nodes:   There is no cervical, clavicular, axillary or inguinal lymphadenopathy.   LABS:   CBC Latest Ref Rng & Units 12/31/2020 12/03/2020 11/12/2020  WBC - 6.5 5.3 5.9  Hemoglobin 13.5 - 17.5 12.4(A) 12.2(A) 11.5(A)  Hematocrit 41 - 53 37(A) 36(A) 34(A)  Platelets 150 - 399 339 212 237   CMP Latest Ref Rng & Units 12/31/2020 12/03/2020 11/12/2020  Glucose 70 - 99 mg/dL - - -  BUN 4 - _0 Creatinine 0.6 - 1.3 1.0 0.9 0.9  Sodium 137 - 147 139 138 139  Potassium 3.4 - 5.3 4.1 3.7 4.4  Chloride 99 - 108 104 101 105  CO2 13 -  22 26(A) 27(A) 25(A)  Calcium 8.7 - 10.7 9.1 9.1 9.0  Total Protein 6.5 - 8.1 g/dL - - -  Total Bilirubin 0.3 - 1.2 mg/dL - - -  Alkaline Phos 25 - 125 51 56 46  AST 14 - 40 34 30 31  ALT 10 - 40  _0 Lab Results  Component Value Date   PSA1 1.6 10/22/2020    STUDIES:   No current studies  HISTORY:   Allergies:  Allergies  Allergen Reactions  . Other Other (See Comments)    Grass Pollen     Current Medications: Current Outpatient Medications  Medication Sig Dispense Refill  . bicalutamide (CASODEX) 50 MG tablet Take 40 mg by mouth daily.    Marland Kitchen dexamethasone (DECADRON) 4 MG tablet Take 4 mg by mouth as directed.    . ondansetron (ZOFRAN) 4 MG tablet Take 4 mg by mouth every 8 (eight) hours as needed for nausea or vomiting.    Marland Kitchen oxyCODONE (OXY IR/ROXICODONE) 5 MG immediate release tablet Take 5 mg by mouth every 4 (four) hours as needed for severe pain.    . pantoprazole (PROTONIX) 40 MG tablet pantoprazole 40 mg tablet,delayed release  TAKE 1 TABLET BY MOUTH TWICE DAILY WITH BREAKFAST AND EVENING MEAL    . predniSONE (DELTASONE) 5 MG tablet Take 1 tablet by mouth twice daily 60 tablet 5  . prochlorperazine (COMPAZINE) 10 MG tablet Take 10 mg by mouth every 6 (six) hours as needed for nausea or vomiting.    . temazepam (RESTORIL) 15 MG capsule Take 15 mg by mouth at bedtime as needed for sleep.     No current facility-administered medications for this visit.     ASSESSMENT & PLAN:   Assessment: 1.  Stage III prostate cancer diagnosed in April 2019 who was treated with radical prostatectomy but had positive margins and involvement of the seminal vesicles as well as a Gleason 9 score and lymphovascular invasion.  He was seen by Dr. Orlene Erm who recommended adjuvant radiation therapy, which he finally completed a total cumulative dose of 7940 cGy in September 2020.  He continues Lupron injections every 3-4 months and was placed on bicalutamide in March 2021.  His  PSA from April had decreased from 26.1 to 9.7, down to 2.4 in October, and 1.6 in November.     2.  Liver metastases, diagnosed in May of 2021, confirmed by biopsy to be metastatic from his prostate cancer.  There are 4 lesions in total measuring from 0.6 cm to 3 cm.  He is now receiving IV chemotherapy with docetaxel and prednisone, and CT imaging has shown improvement in all 3 lesions and the 4th one may represent a cyst.  I do recommend that we continue with therapy, at this time since he has minimal toxicity and it has been effective.  3. Improving anemia, B12, folate and iron studies were normal.  4.  Evidence of steroid myopathy.  I advised that he decrease the prednisone to 5 mg daily. This is improving since the decrease.  Plan: He will proceed with a 11th cycle of docetaxel on Wednesday. He knows to continue his prednisone to 5 mg daily.  We will see him back in 3 weeks with CBC and CMP prior to a 12th cycle of docetaxel.  We will plan for repeat CT imaging in late February or early March.  He verbalizes understanding of an agreement to the plan today.  He knows to call the office should any new questions or concerns arise.  The patient understands the plans discussed today and is in agreement with them.  The patient knows to contact our office if he has issues requiring immediate clinical assessment.   Dayton Scrape, [place]  AT [place] [address] Dept: [phone] Dept Fax: [phone]

## 2021-01-02 ENCOUNTER — Other Ambulatory Visit: Payer: Self-pay

## 2021-01-02 ENCOUNTER — Inpatient Hospital Stay: Payer: Medicaid Other

## 2021-01-02 ENCOUNTER — Telehealth: Payer: Self-pay | Admitting: Oncology

## 2021-01-02 VITALS — BP 129/97 | HR 83 | Temp 98.2°F | Resp 18 | Ht 67.0 in | Wt 142.2 lb

## 2021-01-02 DIAGNOSIS — Z5111 Encounter for antineoplastic chemotherapy: Secondary | ICD-10-CM | POA: Diagnosis not present

## 2021-01-02 DIAGNOSIS — C61 Malignant neoplasm of prostate: Secondary | ICD-10-CM

## 2021-01-02 MED ORDER — DEXAMETHASONE SODIUM PHOSPHATE 100 MG/10ML IJ SOLN
10.0000 mg | Freq: Once | INTRAMUSCULAR | Status: AC
  Administered 2021-01-02: 10 mg via INTRAVENOUS
  Filled 2021-01-02: qty 10

## 2021-01-02 MED ORDER — ONDANSETRON HCL 4 MG/2ML IJ SOLN
8.0000 mg | Freq: Once | INTRAMUSCULAR | Status: AC
  Administered 2021-01-02: 8 mg via INTRAVENOUS

## 2021-01-02 MED ORDER — ONDANSETRON HCL 4 MG/2ML IJ SOLN
INTRAMUSCULAR | Status: AC
  Filled 2021-01-02: qty 4

## 2021-01-02 MED ORDER — HEPARIN SOD (PORK) LOCK FLUSH 100 UNIT/ML IV SOLN
500.0000 [IU] | Freq: Once | INTRAVENOUS | Status: AC | PRN
  Administered 2021-01-02: 500 [IU]
  Filled 2021-01-02: qty 5

## 2021-01-02 MED ORDER — SODIUM CHLORIDE 0.9 % IV SOLN
Freq: Once | INTRAVENOUS | Status: AC
  Filled 2021-01-02: qty 250

## 2021-01-02 MED ORDER — SODIUM CHLORIDE 0.9 % IV SOLN
75.0000 mg/m2 | Freq: Once | INTRAVENOUS | Status: AC
  Administered 2021-01-02: 130 mg via INTRAVENOUS
  Filled 2021-01-02: qty 13

## 2021-01-02 NOTE — Telephone Encounter (Signed)
01/02/21 All appts sched and given to patient 

## 2021-01-02 NOTE — Patient Instructions (Signed)
[facility] Discharge Instructions for Patients Receiving Chemotherapy  Today you received the following chemotherapy agents Doxetaxel  To help prevent nausea and vomiting after your treatment, we encourage you to take your nausea medication.   If you develop nausea and vomiting that is not controlled by your nausea medication, call the clinic.   BELOW ARE SYMPTOMS THAT SHOULD BE REPORTED IMMEDIATELY:  *FEVER GREATER THAN 100.5 F  *CHILLS WITH OR WITHOUT FEVER  NAUSEA AND VOMITING THAT IS NOT CONTROLLED WITH YOUR NAUSEA MEDICATION  *UNUSUAL SHORTNESS OF BREATH  *UNUSUAL BRUISING OR BLEEDING  TENDERNESS IN MOUTH AND THROAT WITH OR WITHOUT PRESENCE OF ULCERS  *URINARY PROBLEMS  *BOWEL PROBLEMS  UNUSUAL RASH Items with * indicate a potential emergency and should be followed up as soon as possible.  Feel free to call the clinic should you have any questions or concerns at The clinic phone number is [phone].  Please show the [facility] at check-in to the Emergency Department and triage nurse.

## 2021-01-04 ENCOUNTER — Other Ambulatory Visit: Payer: Self-pay

## 2021-01-04 ENCOUNTER — Inpatient Hospital Stay: Payer: Medicaid Other

## 2021-01-04 VITALS — BP 134/78 | HR 77 | Temp 98.8°F | Resp 20 | Ht 67.0 in | Wt 149.0 lb

## 2021-01-04 DIAGNOSIS — Z5111 Encounter for antineoplastic chemotherapy: Secondary | ICD-10-CM | POA: Diagnosis not present

## 2021-01-04 DIAGNOSIS — C61 Malignant neoplasm of prostate: Secondary | ICD-10-CM

## 2021-01-04 MED ORDER — PEGFILGRASTIM-BMEZ 6 MG/0.6ML ~~LOC~~ SOSY
6.0000 mg | PREFILLED_SYRINGE | Freq: Once | SUBCUTANEOUS | Status: AC
  Administered 2021-01-04: 6 mg via SUBCUTANEOUS

## 2021-01-04 MED ORDER — PEGFILGRASTIM-BMEZ 6 MG/0.6ML ~~LOC~~ SOSY
PREFILLED_SYRINGE | SUBCUTANEOUS | Status: AC
  Filled 2021-01-04: qty 0.6

## 2021-01-04 NOTE — Progress Notes (Signed)
1308: PT STABLE AT TIME OF DISCHARGE

## 2021-01-04 NOTE — Patient Instructions (Signed)
Pegfilgrastim injection What is this medicine? PEGFILGRASTIM (PEG fil gra stim) is a long-acting granulocyte colony-stimulating factor that stimulates the growth of neutrophils, a type of white blood cell important in the body's fight against infection. It is used to reduce the incidence of fever and infection in patients with certain types of cancer who are receiving chemotherapy that affects the bone marrow, and to increase survival after being exposed to high doses of radiation. This medicine may be used for other purposes; ask your health care provider or pharmacist if you have questions. COMMON BRAND NAME(S): Fulphila, Neulasta, Nyvepria, UDENYCA, Ziextenzo What should I tell my health care provider before I take this medicine? They need to know if you have any of these conditions:  kidney disease  latex allergy  ongoing radiation therapy  sickle cell disease  skin reactions to acrylic adhesives (On-Body Injector only)  an unusual or allergic reaction to pegfilgrastim, filgrastim, other medicines, foods, dyes, or preservatives  pregnant or trying to get pregnant  breast-feeding How should I use this medicine? This medicine is for injection under the skin. If you get this medicine at home, you will be taught how to prepare and give the pre-filled syringe or how to use the On-body Injector. Refer to the patient Instructions for Use for detailed instructions. Use exactly as directed. Tell your healthcare provider immediately if you suspect that the On-body Injector may not have performed as intended or if you suspect the use of the On-body Injector resulted in a missed or partial dose. It is important that you put your used needles and syringes in a special sharps container. Do not put them in a trash can. If you do not have a sharps container, call your pharmacist or healthcare provider to get one. Talk to your pediatrician regarding the use of this medicine in children. While this drug  may be prescribed for selected conditions, precautions do apply. Overdosage: If you think you have taken too much of this medicine contact a poison control center or emergency room at once. NOTE: This medicine is only for you. Do not share this medicine with others. What if I miss a dose? It is important not to miss your dose. Call your doctor or health care professional if you miss your dose. If you miss a dose due to an On-body Injector failure or leakage, a new dose should be administered as soon as possible using a single prefilled syringe for manual use. What may interact with this medicine? Interactions have not been studied. This list may not describe all possible interactions. Give your health care provider a list of all the medicines, herbs, non-prescription drugs, or dietary supplements you use. Also tell them if you smoke, drink alcohol, or use illegal drugs. Some items may interact with your medicine. What should I watch for while using this medicine? Your condition will be monitored carefully while you are receiving this medicine. You may need blood work done while you are taking this medicine. Talk to your health care provider about your risk of cancer. You may be more at risk for certain types of cancer if you take this medicine. If you are going to need a MRI, CT scan, or other procedure, tell your doctor that you are using this medicine (On-Body Injector only). What side effects may I notice from receiving this medicine? Side effects that you should report to your doctor or health care professional as soon as possible:  allergic reactions (skin rash, itching or hives, swelling of   the face, lips, or tongue)  back pain  dizziness  fever  pain, redness, or irritation at site where injected  pinpoint red spots on the skin  red or dark-brown urine  shortness of breath or breathing problems  stomach or side pain, or pain at the shoulder  swelling  tiredness  trouble  passing urine or change in the amount of urine  unusual bruising or bleeding Side effects that usually do not require medical attention (report to your doctor or health care professional if they continue or are bothersome):  bone pain  muscle pain This list may not describe all possible side effects. Call your doctor for medical advice about side effects. You may report side effects to FDA at 1-800-FDA-1088. Where should I keep my medicine? Keep out of the reach of children. If you are using this medicine at home, you will be instructed on how to store it. Throw away any unused medicine after the expiration date on the label. NOTE: This sheet is a summary. It may not cover all possible information. If you have questions about this medicine, talk to your doctor, pharmacist, or health care provider.  2021 Elsevier/Gold Standard (2019-12-16 13:20:51)  

## 2021-01-18 NOTE — Progress Notes (Signed)
Jeffrey Lambert  [address] [place],  [place]  [address] [phone]  Clinic Day:  01/21/2021  Referring physician: Reita May, NP     CHIEF COMPLAINT:  CC:   Metastatic prostate cancer including spread of his disease to his liver.  Current Treatment:   Docetaxel/prednisone, in addition to Lupron every 4 months.   HISTORY OF PRESENT ILLNESS:  Jeffrey Lambert is a 65 y.o. 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  He had symptoms of urinary frequency and a steadily rising PSA.  Biopsy confirmed prostatic adenocarcinoma with a Gleason 8, 4+ 4, in 100% of the biopsy submitted.  He also had cystoscopy at that time, which was benign.  He has had hematuria and and hematospermia in the past.  CT imaging in May revealed mild prostatic enlargement and a bone scan was negative for metastatic disease.  He underwent radical prostatectomy and bilateral pelvic lymphadenectomy in May 2019.  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  There was extraprostatic extension at bilateral apices and positive margins at the left posterior lateral wall.  There was also infiltration of the seminal vesicles.  The tumor volume was approximately 50% of the gland.  Bilateral lymph nodes were clear.  He had a weight loss of 80 lb in the perioperative period.  Due to his aggressive prostate cancer and family history of malignancy, he underwent testing for hereditary cancer syndromes with the [employer] myRisk Hereditary Cancer Panel.  This did not reveal any clinically significant mutation or variants of uncertain significance.  He was recommended for adjuvant radiation therapy.  He received 2720 cGy for 15 fractions and then stopped in August 2019.  By January 2020, he had 1 treatment and in May 2020 he went back on daily treatment and had the other 5220 cGy in 29 fractions for a total cumulative dose of  7940 cGy.  PET scan in July 2020 revealed mild hypermetabolic activity with soft tissue thickening in the deep pelvis felt to be postsurgical in nature.  There was no evidence of nodal metastasis or distant metastasis.  CT abdomen and pelvis done in the emergency room August 2020 revealed two hypodense liver lesions, new compared to imaging from 2019, ranging from 16-25 mm, which were suspicious for liver metastases, but were PET negative the month prior.    He continued to follow with Dr. Nila Nephew and remained on Lupron injections every 4 months. The PSA was up 26.1 in January.  Bicalutamide was added in March.  The PSA had decreased to 9.7 in April.  We began seeing him again in May 2021, and the PSA was stable at 9.6.  We recommended CT imaging and bone scan.  CT abdomen and pelvis on May 20th revealed interval progression of liver metastases with the largest measuring 3 cm.  Ultrasound guided biopsy from June 10th revealed metastatic carcinoma consistent with the patient's known primary prostatic cancer.  Whole body bone scan from May 20th was negative for metastatic disease, but revealed degenerative changes throughout the cervical spine, as well as within the bilateral shoulders and bilateral knees.  Due to his liver metastasis, we recommended palliative IV chemotherapy with docetaxel, in addition to prednisone 5 twice daily.  He had insomnia due to additional dexamethasone premedication prior to chemotherapy, so the afternoon dose of his dexamethasone was discontinued with improvement in his muscle weakness. He continued his hormonal therapy.  CT chest,  abdomen and pelvis from September 7th revealed stable right lower lobe pulmonary nodules with no new or progressive findings.  There  Was interval decrease an size of the hepatic metastatic lesions.  The largest of these lesions previously measuring 30 mm, now measure 16 mm.  No new lesions or abdominal/pelvic lymphadenopathy was observed.  There were no  findings of osseous metastatic disease.  On October 25th, his PSA had decreased to 1.8, and down to 1.6 in November.  CT abdomen and pelvis from December 6th revealed improvement in the hepatic metastasis with the largest lesion now measuring 1.7 cm, previously 1.9 cm.  No abdominopelvic adenopathy or evidence of extrahepatic metastatic disease was observed.    INTERVAL HISTORY:  Jeffrey Lambert is here prior to a 12th cycle of docetaxel, which he continues to tolerate fairly well.  He continues prednisone 5 mg daily. He states his PSA was 0.3 at Dr. Nila Nephew is office in the last few weeks.  He states he had cold symptoms a few weeks ago with cough and flu-like symptoms without fevers, with resolution within a few days.  He did not contact us or seek medical attention.  This could have been a mild COVID-19 infection  He experiences nausea and constipation right after chemotherapy, for which medication is effective.  Overall, he continues to do well.  He denies fevers or chills.  He denies pain.  His appetite is good.  His weight has increased 3 lb in 3 weeks.  REVIEW OF SYSTEMS:  Review of Systems  Constitutional: Negative for appetite change, chills, fatigue and fever.  HENT:   Negative for lump/mass, mouth sores and sore throat.   Eyes: Negative for icterus.  Respiratory: Negative for cough and shortness of breath.   Cardiovascular: Negative for chest pain, leg swelling and palpitations.  Gastrointestinal: Negative for abdominal pain, blood in stool, constipation, diarrhea, nausea and vomiting.  Endocrine: Negative for hot flashes.  Genitourinary: Negative for difficulty urinating, dysuria, frequency and hematuria.   Musculoskeletal: Negative for arthralgias and myalgias.  Skin: Negative for itching, rash and wound.  Neurological: Negative for dizziness, extremity weakness, headaches, light-headedness and numbness.  Hematological: Negative for adenopathy. Does not bruise/bleed easily.   Psychiatric/Behavioral: Negative for depression. The patient is not nervous/anxious.      VITALS:  Blood pressure 132/71, pulse 73, temperature 98.6 F (37 C), temperature source Oral, resp. rate 18, height 5' 7"  (1.702 m), weight 152 lb 11.2 oz (69.3 kg), SpO2 99 %.  Wt Readings from Last 3 Encounters:  01/21/21 152 lb 11.2 oz (69.3 kg)  01/04/21 149 lb 0.1 oz (67.6 kg)  01/02/21 142 lb 4 oz (64.5 kg)    Body mass index is 23.92 kg/m.  Performance status (ECOG): 1 - Symptomatic but completely ambulatory  PHYSICAL EXAM:  Physical Exam Constitutional:      General: He is not in acute distress.    Appearance: Normal appearance. He is normal weight. He is not ill-appearing, toxic-appearing or diaphoretic.  HENT:     Head: Normocephalic and atraumatic.     Mouth/Throat:     Mouth: Mucous membranes are moist.     Pharynx: Oropharynx is clear. No oropharyngeal exudate or posterior oropharyngeal erythema.  Eyes:     General: No scleral icterus.    Extraocular Movements: Extraocular movements intact.     Conjunctiva/sclera: Conjunctivae normal.     Pupils: Pupils are equal, round, and reactive to light.  Cardiovascular:     Rate and Rhythm: Normal rate and regular  rhythm.     Heart sounds: No murmur heard. No friction rub. No gallop.   Pulmonary:     Effort: Pulmonary effort is normal. No respiratory distress.     Breath sounds: Normal breath sounds. No stridor. No wheezing, rhonchi or rales.  Chest:  Breasts:     Right: No axillary adenopathy or supraclavicular adenopathy.     Left: No axillary adenopathy or supraclavicular adenopathy.    Abdominal:     General: Abdomen is flat. Bowel sounds are normal. There is no distension.     Palpations: There is no hepatomegaly, splenomegaly or mass.     Tenderness: There is no abdominal tenderness. There is no guarding.     Hernia: No hernia is present.  Musculoskeletal:        General: Normal range of motion.     Cervical back:  Normal range of motion and neck supple. No tenderness.     Right lower leg: No edema.     Left lower leg: No edema.  Lymphadenopathy:     Cervical: No cervical adenopathy.     Upper Body:     Right upper body: No supraclavicular or axillary adenopathy.     Left upper body: No supraclavicular or axillary adenopathy.     Lower Body: No right inguinal adenopathy. No left inguinal adenopathy.  Skin:    General: Skin is warm and dry.     Capillary Refill: Capillary refill takes less than 2 seconds.     Coloration: Skin is not jaundiced.     Findings: No rash.  Neurological:     Mental Status: He is alert and oriented to person, place, and time. Mental status is at baseline.     Cranial Nerves: No cranial nerve deficit.  Psychiatric:        Mood and Affect: Mood normal.        Behavior: Behavior normal.        Thought Content: Thought content normal.    LABS:   CBC Latest Ref Rng & Units 01/21/2021 12/31/2020 12/03/2020  WBC - 3.0 6.5 5.3  Hemoglobin 13.5 - 17.5 11.9(A) 12.4(A) 12.2(A)  Hematocrit 41 - 53 35(A) 37(A) 36(A)  Platelets 150 - 399 222 339 212   CMP Latest Ref Rng & Units 01/21/2021 12/31/2020 12/03/2020  Glucose 70 - 99 mg/dL - - -  BUN 4 - 21 14 14 13   Creatinine 0.6 - 1.3 0.8 1.0 0.9  Sodium 137 - 147 137 139 138  Potassium 3.4 - 5.3 3.9 4.1 3.7  Chloride 99 - 108 105 104 101  CO2 13 - 22 25(A) 26(A) 27(A)  Calcium 8.7 - 10.7 9.0 9.1 9.1  Total Protein 6.5 - 8.1 g/dL - - -  Total Bilirubin 0.3 - 1.2 mg/dL - - -  Alkaline Phos 25 - 125 46 51 56  AST 14 - 40 33 34 30  ALT 10 - 40 24 27 20       Lab Results  Component Value Date   PSA1 1.6 10/22/2020    STUDIES:   No current studies  HISTORY:   Allergies:  Allergies  Allergen Reactions  . Other Other (See Comments)    Grass Pollen     Current Medications: Current Outpatient Medications  Medication Sig Dispense Refill  . bicalutamide (CASODEX) 50 MG tablet Take 40 mg by mouth daily.    Marland Kitchen  dexamethasone (DECADRON) 4 MG tablet Take 4 mg by mouth as directed.    . ondansetron (ZOFRAN)  4 MG tablet Take 4 mg by mouth every 8 (eight) hours as needed for nausea or vomiting.    Marland Kitchen oxyCODONE (OXY IR/ROXICODONE) 5 MG immediate release tablet Take 5 mg by mouth every 4 (four) hours as needed for severe pain.    . pantoprazole (PROTONIX) 40 MG tablet pantoprazole 40 mg tablet,delayed release  TAKE 1 TABLET BY MOUTH TWICE DAILY WITH BREAKFAST AND EVENING MEAL    . predniSONE (DELTASONE) 5 MG tablet Take 1 tablet by mouth twice daily 60 tablet 5  . prochlorperazine (COMPAZINE) 10 MG tablet Take 10 mg by mouth every 6 (six) hours as needed for nausea or vomiting.    . temazepam (RESTORIL) 15 MG capsule Take 15 mg by mouth at bedtime as needed for sleep.     No current facility-administered medications for this visit.     ASSESSMENT & PLAN:   Assessment: 1.  Stage III prostate cancer diagnosed in April 2019 who was treated with radical prostatectomy but had positive margins and involvement of the seminal vesicles as well as a Gleason 9 score and lymphovascular invasion.  He was seen by Dr. Orlene Erm who recommended adjuvant radiation therapy, which he finally completed a total cumulative dose of 7940 cGy in September 2020.  He continues Lupron injections every 3-4 months and was placed on bicalutamide in March 2021.  His PSA has continued to decrease and most recently was 0.3 at Dr. Ara Kussmaul office.  2.  Liver metastases, biopsy proven to be metastatic from his prostate cancer.   He is receiving palliative docetaxel/prednisone.  Most recent CT imaging  revealed improvement in all 3 liver lesions.  A 4th liver lesion may represent a cyst.  3.  Macrocytic anemia, which has slightly worsened. Repeat B12 and folate were normal  4.  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  5. Mild neutropenia, we will still proceed with chemotherapy and continue to monitor this.   6.  Recent respiratory infection, which may have represented COVID-19.  The patient seems to have fully recovered.  Plan:   He will proceed with a 12th cycle of docetaxel this week. He knows to continue his prednisone to 5 mg daily.  We will see him back in 3 weeks with CBC, comprehensive metabolic panel and CT chest, abdomen and pelvis to reassess his disease baseline prior to a potential 13th cycle of docetaxel.  The patient understands the plans discussed today and is in agreement with them.  The patient knows to contact our office if he has issues requiring immediate clinical assessment.   Marianela Mandrell A. [employer] Housekeeper, [place] [address] [place] [address] Dept: [phone] Dept Fax: [phone]

## 2021-01-21 ENCOUNTER — Other Ambulatory Visit: Payer: Self-pay | Admitting: Oncology

## 2021-01-21 ENCOUNTER — Inpatient Hospital Stay: Payer: Medicaid Other | Attending: Oncology | Admitting: Hematology and Oncology

## 2021-01-21 ENCOUNTER — Other Ambulatory Visit: Payer: Self-pay

## 2021-01-21 ENCOUNTER — Encounter: Payer: Self-pay | Admitting: Hematology and Oncology

## 2021-01-21 ENCOUNTER — Telehealth: Payer: Self-pay | Admitting: Hematology and Oncology

## 2021-01-21 ENCOUNTER — Inpatient Hospital Stay: Payer: Medicaid Other

## 2021-01-21 VITALS — BP 132/71 | HR 73 | Temp 98.6°F | Resp 18 | Ht 67.0 in | Wt 152.7 lb

## 2021-01-21 DIAGNOSIS — D649 Anemia, unspecified: Secondary | ICD-10-CM | POA: Diagnosis not present

## 2021-01-21 DIAGNOSIS — Z79899 Other long term (current) drug therapy: Secondary | ICD-10-CM | POA: Insufficient documentation

## 2021-01-21 DIAGNOSIS — M170 Bilateral primary osteoarthritis of knee: Secondary | ICD-10-CM | POA: Insufficient documentation

## 2021-01-21 DIAGNOSIS — Z7952 Long term (current) use of systemic steroids: Secondary | ICD-10-CM | POA: Insufficient documentation

## 2021-01-21 DIAGNOSIS — N401 Enlarged prostate with lower urinary tract symptoms: Secondary | ICD-10-CM | POA: Insufficient documentation

## 2021-01-21 DIAGNOSIS — G720 Drug-induced myopathy: Secondary | ICD-10-CM | POA: Insufficient documentation

## 2021-01-21 DIAGNOSIS — Z5111 Encounter for antineoplastic chemotherapy: Secondary | ICD-10-CM | POA: Insufficient documentation

## 2021-01-21 DIAGNOSIS — C61 Malignant neoplasm of prostate: Secondary | ICD-10-CM | POA: Diagnosis not present

## 2021-01-21 DIAGNOSIS — M47812 Spondylosis without myelopathy or radiculopathy, cervical region: Secondary | ICD-10-CM | POA: Insufficient documentation

## 2021-01-21 DIAGNOSIS — D539 Nutritional anemia, unspecified: Secondary | ICD-10-CM | POA: Insufficient documentation

## 2021-01-21 DIAGNOSIS — Z5189 Encounter for other specified aftercare: Secondary | ICD-10-CM | POA: Insufficient documentation

## 2021-01-21 DIAGNOSIS — D709 Neutropenia, unspecified: Secondary | ICD-10-CM | POA: Insufficient documentation

## 2021-01-21 DIAGNOSIS — R918 Other nonspecific abnormal finding of lung field: Secondary | ICD-10-CM | POA: Insufficient documentation

## 2021-01-21 DIAGNOSIS — G4700 Insomnia, unspecified: Secondary | ICD-10-CM | POA: Insufficient documentation

## 2021-01-21 DIAGNOSIS — C787 Secondary malignant neoplasm of liver and intrahepatic bile duct: Secondary | ICD-10-CM

## 2021-01-21 DIAGNOSIS — Z9079 Acquired absence of other genital organ(s): Secondary | ICD-10-CM | POA: Insufficient documentation

## 2021-01-21 DIAGNOSIS — T380X5A Adverse effect of glucocorticoids and synthetic analogues, initial encounter: Secondary | ICD-10-CM | POA: Insufficient documentation

## 2021-01-21 LAB — BASIC METABOLIC PANEL
BUN: 14 (ref 4–21)
CO2: 25 — AB (ref 13–22)
Chloride: 105 (ref 99–108)
Creatinine: 0.8 (ref 0.6–1.3)
Glucose: 100
Potassium: 3.9 (ref 3.4–5.3)
Sodium: 137 (ref 137–147)

## 2021-01-21 LAB — HEPATIC FUNCTION PANEL
ALT: 24 (ref 10–40)
AST: 33 (ref 14–40)
Alkaline Phosphatase: 46 (ref 25–125)
Bilirubin, Total: 0.6

## 2021-01-21 LAB — CBC AND DIFFERENTIAL
HCT: 35 — AB (ref 41–53)
Hemoglobin: 11.9 — AB (ref 13.5–17.5)
Neutrophils Absolute: 1.38
Platelets: 222 (ref 150–399)
WBC: 3

## 2021-01-21 LAB — CBC
MCV: 97 — AB (ref 80–94)
RBC: 3.59 — AB (ref 3.87–5.11)

## 2021-01-21 LAB — COMPREHENSIVE METABOLIC PANEL
Albumin: 4.3 (ref 3.5–5.0)
Calcium: 9 (ref 8.7–10.7)

## 2021-01-21 NOTE — Telephone Encounter (Signed)
01/21/21 next appt sched and given to patient

## 2021-01-22 ENCOUNTER — Other Ambulatory Visit: Payer: Self-pay | Admitting: Pharmacist

## 2021-01-22 ENCOUNTER — Telehealth: Payer: Self-pay

## 2021-01-22 ENCOUNTER — Inpatient Hospital Stay: Payer: Medicaid Other

## 2021-01-22 ENCOUNTER — Other Ambulatory Visit: Payer: Self-pay

## 2021-01-22 NOTE — Telephone Encounter (Addendum)
2/16 1322; Pt's PCR test was negative.    I called pt back. His COVID antigen test @ home was negative. He will come here today @ 130p for PCR.    ----- Message from Marvia Pickles, PA-C sent at 01/22/2021 10:12 AM EST ----- Regarding: RE: Cold symptoms this morning If negative, will need PCR-they do rapid at [facility] (or bring him here since he has chemo scheduled tomorrow) If positive, skip chemo and send referral to Dr. Doran Durand office for him to be treated for COVID. Thanks!  ----- Message ----- From: Dairl Ponder, RN Sent: 01/22/2021   9:40 AM EST To: Marvia Pickles, PA-C Subject: Cold symptoms this morning                     Pt has called this morning. "I'm not really feeling well this morning. I had upper respiratory infection over a week ago, but it got better. I talked to [facility] about it yesterday. This morning I have some chest congestion and headache this morning". He is worried about taking next chemo until this clears up completely. "I know this next chemo is going to be hard, like the last one. I have 3 COVID antigen tests that were sent to me. Should I take one"? Pt denies fever, but admits to runny nose. No loss of taste & smell. I'm pretty sure he wants to delay chemo maybe a week, if he wont harm him more. He knows his next scan is in March.  I told him to go ahead & take the test and I would send message to you for recommendations.

## 2021-01-23 ENCOUNTER — Other Ambulatory Visit: Payer: Self-pay

## 2021-01-23 ENCOUNTER — Inpatient Hospital Stay: Payer: Medicaid Other

## 2021-01-23 VITALS — BP 122/92 | HR 65 | Temp 97.8°F | Resp 18 | Ht 67.0 in | Wt 149.5 lb

## 2021-01-23 DIAGNOSIS — N401 Enlarged prostate with lower urinary tract symptoms: Secondary | ICD-10-CM | POA: Diagnosis not present

## 2021-01-23 DIAGNOSIS — D539 Nutritional anemia, unspecified: Secondary | ICD-10-CM | POA: Diagnosis not present

## 2021-01-23 DIAGNOSIS — Z79899 Other long term (current) drug therapy: Secondary | ICD-10-CM | POA: Diagnosis not present

## 2021-01-23 DIAGNOSIS — Z7952 Long term (current) use of systemic steroids: Secondary | ICD-10-CM | POA: Diagnosis not present

## 2021-01-23 DIAGNOSIS — C61 Malignant neoplasm of prostate: Secondary | ICD-10-CM | POA: Diagnosis present

## 2021-01-23 DIAGNOSIS — Z5111 Encounter for antineoplastic chemotherapy: Secondary | ICD-10-CM | POA: Diagnosis present

## 2021-01-23 DIAGNOSIS — Z9079 Acquired absence of other genital organ(s): Secondary | ICD-10-CM | POA: Diagnosis not present

## 2021-01-23 DIAGNOSIS — C787 Secondary malignant neoplasm of liver and intrahepatic bile duct: Secondary | ICD-10-CM | POA: Diagnosis not present

## 2021-01-23 DIAGNOSIS — G720 Drug-induced myopathy: Secondary | ICD-10-CM | POA: Diagnosis not present

## 2021-01-23 DIAGNOSIS — M47812 Spondylosis without myelopathy or radiculopathy, cervical region: Secondary | ICD-10-CM | POA: Diagnosis not present

## 2021-01-23 DIAGNOSIS — Z5189 Encounter for other specified aftercare: Secondary | ICD-10-CM | POA: Diagnosis not present

## 2021-01-23 DIAGNOSIS — G4700 Insomnia, unspecified: Secondary | ICD-10-CM | POA: Diagnosis not present

## 2021-01-23 DIAGNOSIS — T380X5A Adverse effect of glucocorticoids and synthetic analogues, initial encounter: Secondary | ICD-10-CM | POA: Diagnosis not present

## 2021-01-23 DIAGNOSIS — R918 Other nonspecific abnormal finding of lung field: Secondary | ICD-10-CM | POA: Diagnosis not present

## 2021-01-23 DIAGNOSIS — D709 Neutropenia, unspecified: Secondary | ICD-10-CM | POA: Diagnosis not present

## 2021-01-23 DIAGNOSIS — M170 Bilateral primary osteoarthritis of knee: Secondary | ICD-10-CM | POA: Diagnosis not present

## 2021-01-23 MED ORDER — SODIUM CHLORIDE 0.9 % IV SOLN
75.0000 mg/m2 | Freq: Once | INTRAVENOUS | Status: AC
  Administered 2021-01-23: 130 mg via INTRAVENOUS
  Filled 2021-01-23: qty 13

## 2021-01-23 MED ORDER — HEPARIN SOD (PORK) LOCK FLUSH 100 UNIT/ML IV SOLN
500.0000 [IU] | Freq: Once | INTRAVENOUS | Status: AC | PRN
  Administered 2021-01-23: 500 [IU]
  Filled 2021-01-23: qty 5

## 2021-01-23 MED ORDER — ONDANSETRON HCL 4 MG/2ML IJ SOLN
INTRAMUSCULAR | Status: AC
  Filled 2021-01-23: qty 4

## 2021-01-23 MED ORDER — SODIUM CHLORIDE 0.9 % IV SOLN
10.0000 mg | Freq: Once | INTRAVENOUS | Status: AC
  Administered 2021-01-23: 10 mg via INTRAVENOUS
  Filled 2021-01-23: qty 10

## 2021-01-23 MED ORDER — SODIUM CHLORIDE 0.9 % IV SOLN
Freq: Once | INTRAVENOUS | Status: AC
  Filled 2021-01-23: qty 250

## 2021-01-23 MED ORDER — ONDANSETRON HCL 4 MG/2ML IJ SOLN
8.0000 mg | Freq: Once | INTRAMUSCULAR | Status: AC
  Administered 2021-01-23: 8 mg via INTRAVENOUS

## 2021-01-23 NOTE — Telephone Encounter (Signed)
Pt arrived to infusion center this morning, 01/23/2021, for chemo infusion, as scheduled.

## 2021-01-23 NOTE — Patient Instructions (Signed)
[facility] Discharge Instructions for Patients Receiving Chemotherapy  Today you received the following chemotherapy agents DOCETAXEL  To help prevent nausea and vomiting after your treatment, we encourage you to take your nausea medication.   If you develop nausea and vomiting that is not controlled by your nausea medication, call the clinic.   BELOW ARE SYMPTOMS THAT SHOULD BE REPORTED IMMEDIATELY:  *FEVER GREATER THAN 100.5 F  *CHILLS WITH OR WITHOUT FEVER  NAUSEA AND VOMITING THAT IS NOT CONTROLLED WITH YOUR NAUSEA MEDICATION  *UNUSUAL SHORTNESS OF BREATH  *UNUSUAL BRUISING OR BLEEDING  TENDERNESS IN MOUTH AND THROAT WITH OR WITHOUT PRESENCE OF ULCERS  *URINARY PROBLEMS  *BOWEL PROBLEMS  UNUSUAL RASH Items with * indicate a potential emergency and should be followed up as soon as possible.  Feel free to call the clinic should you have any questions or concerns at The clinic phone number is [phone].  Please show the [facility] at check-in to the Emergency Department and triage nurse.

## 2021-01-25 ENCOUNTER — Inpatient Hospital Stay: Payer: Medicaid Other

## 2021-01-25 ENCOUNTER — Other Ambulatory Visit: Payer: Self-pay

## 2021-01-25 VITALS — BP 141/72 | HR 70 | Temp 98.2°F | Resp 18 | Ht 67.0 in | Wt 150.2 lb

## 2021-01-25 DIAGNOSIS — C61 Malignant neoplasm of prostate: Secondary | ICD-10-CM

## 2021-01-25 DIAGNOSIS — Z5111 Encounter for antineoplastic chemotherapy: Secondary | ICD-10-CM | POA: Diagnosis not present

## 2021-01-25 MED ORDER — PEGFILGRASTIM-BMEZ 6 MG/0.6ML ~~LOC~~ SOSY
6.0000 mg | PREFILLED_SYRINGE | Freq: Once | SUBCUTANEOUS | Status: AC
  Administered 2021-01-25: 6 mg via SUBCUTANEOUS

## 2021-01-25 MED ORDER — PEGFILGRASTIM-BMEZ 6 MG/0.6ML ~~LOC~~ SOSY
PREFILLED_SYRINGE | SUBCUTANEOUS | Status: AC
  Filled 2021-01-25: qty 0.6

## 2021-01-25 NOTE — Patient Instructions (Signed)
Pegfilgrastim injection What is this medicine? PEGFILGRASTIM (PEG fil gra stim) is a long-acting granulocyte colony-stimulating factor that stimulates the growth of neutrophils, a type of white blood cell important in the body's fight against infection. It is used to reduce the incidence of fever and infection in patients with certain types of cancer who are receiving chemotherapy that affects the bone marrow, and to increase survival after being exposed to high doses of radiation. This medicine may be used for other purposes; ask your health care provider or pharmacist if you have questions. COMMON BRAND NAME(S): Fulphila, Neulasta, Nyvepria, UDENYCA, Ziextenzo What should I tell my health care provider before I take this medicine? They need to know if you have any of these conditions:  kidney disease  latex allergy  ongoing radiation therapy  sickle cell disease  skin reactions to acrylic adhesives (On-Body Injector only)  an unusual or allergic reaction to pegfilgrastim, filgrastim, other medicines, foods, dyes, or preservatives  pregnant or trying to get pregnant  breast-feeding How should I use this medicine? This medicine is for injection under the skin. If you get this medicine at home, you will be taught how to prepare and give the pre-filled syringe or how to use the On-body Injector. Refer to the patient Instructions for Use for detailed instructions. Use exactly as directed. Tell your healthcare provider immediately if you suspect that the On-body Injector may not have performed as intended or if you suspect the use of the On-body Injector resulted in a missed or partial dose. It is important that you put your used needles and syringes in a special sharps container. Do not put them in a trash can. If you do not have a sharps container, call your pharmacist or healthcare provider to get one. Talk to your pediatrician regarding the use of this medicine in children. While this drug  may be prescribed for selected conditions, precautions do apply. Overdosage: If you think you have taken too much of this medicine contact a poison control center or emergency room at once. NOTE: This medicine is only for you. Do not share this medicine with others. What if I miss a dose? It is important not to miss your dose. Call your doctor or health care professional if you miss your dose. If you miss a dose due to an On-body Injector failure or leakage, a new dose should be administered as soon as possible using a single prefilled syringe for manual use. What may interact with this medicine? Interactions have not been studied. This list may not describe all possible interactions. Give your health care provider a list of all the medicines, herbs, non-prescription drugs, or dietary supplements you use. Also tell them if you smoke, drink alcohol, or use illegal drugs. Some items may interact with your medicine. What should I watch for while using this medicine? Your condition will be monitored carefully while you are receiving this medicine. You may need blood work done while you are taking this medicine. Talk to your health care provider about your risk of cancer. You may be more at risk for certain types of cancer if you take this medicine. If you are going to need a MRI, CT scan, or other procedure, tell your doctor that you are using this medicine (On-Body Injector only). What side effects may I notice from receiving this medicine? Side effects that you should report to your doctor or health care professional as soon as possible:  allergic reactions (skin rash, itching or hives, swelling of   the face, lips, or tongue)  back pain  dizziness  fever  pain, redness, or irritation at site where injected  pinpoint red spots on the skin  red or dark-brown urine  shortness of breath or breathing problems  stomach or side pain, or pain at the shoulder  swelling  tiredness  trouble  passing urine or change in the amount of urine  unusual bruising or bleeding Side effects that usually do not require medical attention (report to your doctor or health care professional if they continue or are bothersome):  bone pain  muscle pain This list may not describe all possible side effects. Call your doctor for medical advice about side effects. You may report side effects to FDA at 1-800-FDA-1088. Where should I keep my medicine? Keep out of the reach of children. If you are using this medicine at home, you will be instructed on how to store it. Throw away any unused medicine after the expiration date on the label. NOTE: This sheet is a summary. It may not cover all possible information. If you have questions about this medicine, talk to your doctor, pharmacist, or health care provider.  2021 Elsevier/Gold Standard (2019-12-16 13:20:51)  

## 2021-02-06 ENCOUNTER — Other Ambulatory Visit: Payer: Self-pay | Admitting: Oncology

## 2021-02-08 ENCOUNTER — Ambulatory Visit (HOSPITAL_COMMUNITY): Payer: Medicaid Other

## 2021-02-08 NOTE — Progress Notes (Incomplete)
[facility]  [address] [place],  [place]  [address] [phone]  Clinic Day:  02/08/2021  Referring physician: Reita May, NP   This document serves as a record of services personally performed by [name], MD. It was created on their behalf by [name],[name], a trained medical scribe. The creation of this record is based on the scribe's personal observations and the provider's statements to them.  CHIEF COMPLAINT:  CC:   Metastatic prostate cancer including spread of his disease to his liver.  Current Treatment:   Docetaxel/prednisone, in addition to Lupron every 4 months.  HISTORY OF PRESENT ILLNESS:  Jeffrey Lambert is a 65 y.o. 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  He had symptoms of urinary frequency and a steadily rising PSA.  Biopsy confirmed prostatic adenocarcinoma with a Gleason 8, 4+ 4, in 100% of the biopsy submitted.  He also had cystoscopy at that time, which was benign.  He has had hematuria and and hematospermia in the past.  CT imaging in May revealed mild prostatic enlargement and a bone scan was negative for metastatic disease.  He underwent radical prostatectomy and bilateral pelvic lymphadenectomy in May 2019.  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  There was extraprostatic extension at bilateral apices and positive margins at the left posterior lateral wall.  There was also infiltration of the seminal vesicles.  The tumor volume was approximately 50% of the gland.  Bilateral lymph nodes were clear.  He had a weight loss of 80 lb in the perioperative period.  Due to his aggressive prostate cancer and family history of malignancy, he underwent testing for hereditary cancer syndromes with the [employer] myRisk Hereditary Cancer Panel.  This did not reveal any clinically significant mutation or variants of uncertain significance.  He was  recommended for adjuvant radiation therapy.  He received 2720 cGy for 15 fractions and then stopped in August 2019.  By January 2020, he had 1 treatment and in May 2020 he went back on daily treatment and had the other 5220 cGy in 29 fractions for a total cumulative dose of 7940 cGy.  PET scan in July 2020 revealed mild hypermetabolic activity with soft tissue thickening in the deep pelvis felt to be postsurgical in nature.  There was no evidence of nodal metastasis or distant metastasis.  CT abdomen and pelvis done in the emergency room August 2020 revealed two hypodense liver lesions, new compared to imaging from 2019, ranging from 16-25 mm, which were suspicious for liver metastases, but were PET negative the month prior.    He continued to follow with Dr. Nila Nephew and remained on Lupron injections every 4 months. The PSA was up 26.1 in January.  Bicalutamide was added in March.  The PSA had decreased to 9.7 in April.  We began seeing him again in May 2021, and the PSA was stable at 9.6.  We recommended CT imaging and bone scan.  CT abdomen and pelvis on May 20th revealed interval progression of liver metastases with the largest measuring 3 cm.  Ultrasound guided biopsy from June 10th revealed metastatic carcinoma consistent with the patient's known primary prostatic cancer.  Whole body bone scan from May 20th was negative for metastatic disease, but revealed degenerative changes throughout the cervical spine, as well as within the bilateral shoulders and bilateral knees.  Due to his liver metastasis, we recommended palliative IV chemotherapy with  docetaxel, in addition to prednisone 5 twice daily.  He had insomnia due to additional dexamethasone premedication prior to chemotherapy, so the afternoon dose of his dexamethasone was discontinued with improvement in his muscle weakness. He continued his hormonal therapy.  CT chest, abdomen and pelvis from September 7th revealed stable right lower lobe pulmonary nodules  with no new or progressive findings.  There  Was interval decrease an size of the hepatic metastatic lesions.  The largest of these lesions previously measuring 30 mm, now measure 16 mm.  No new lesions or abdominal/pelvic lymphadenopathy was observed.  There were no findings of osseous metastatic disease.  On October 25th, his PSA had decreased to 1.8, and down to 1.6 in November.  CT abdomen and pelvis from December 6th revealed improvement in the hepatic metastasis with the largest lesion now measuring 1.7 cm, previously 1.9 cm.  No abdominopelvic adenopathy or evidence of extrahepatic metastatic disease was observed.    INTERVAL HISTORY:  Jeffrey Lambert is here prior to a 12th cycle of docetaxel, which he continues to tolerate fairly well.  He continues prednisone 5 mg daily. He states his PSA was 0.3 at Dr. Nila Nephew is office in the last few weeks.  He states he had cold symptoms a few weeks ago with cough and flu-like symptoms without fevers, with resolution within a few days.  He did not contact us or seek medical attention.  This could have been a mild COVID-19 infection  He experiences nausea and constipation right after chemotherapy, for which medication is effective.  Overall, he continues to do well.  He denies fevers or chills.  He denies pain.  His appetite is good.  His weight has increased 3 lb in 3 weeks.  Jeffrey Lambert is here for follow up prior to a 13th cycle of docetaxel.  He is scheduled for CT imaging on March 14th.   His  appetite is good, and he has gained/lost _ pounds since his last visit.  He denies fever, chills or other signs of infection.  He denies nausea, vomiting, bowel issues, or abdominal pain.  He denies sore throat, cough, dyspnea, or chest pain.  REVIEW OF SYSTEMS:  Review of Systems - Oncology   VITALS:  There were no vitals taken for this visit.  Wt Readings from Last 3 Encounters:  01/25/21 150 lb 4 oz (68.2 kg)  01/23/21 149 lb 8 oz (67.8 kg)  01/21/21 152 lb 11.2 oz (69.3  kg)    There is no height or weight on file to calculate BMI.  Performance status (ECOG): 1 - Symptomatic but completely ambulatory  PHYSICAL EXAM:  Physical Exam LABS:   CBC Latest Ref Rng & Units 01/21/2021 12/31/2020 12/03/2020  WBC - 3.0 6.5 5.3  Hemoglobin 13.5 - 17.5 11.9(A) 12.4(A) 12.2(A)  Hematocrit 41 - 53 35(A) 37(A) 36(A)  Platelets 150 - 399 222 339 212   CMP Latest Ref Rng & Units 01/21/2021 12/31/2020 12/03/2020  Glucose 70 - 99 mg/dL - - -  BUN 4 - _0 Creatinine 0.6 - 1.3 0.8 1.0 0.9  Sodium 137 - 147 137 139 138  Potassium 3.4 - 5.3 3.9 4.1 3.7  Chloride 99 - 108 105 104 101  CO2 13 - 22 25(A) 26(A) 27(A)  Calcium 8.7 - 10.7 9.0 9.1 9.1  Total Protein 6.5 - 8.1 g/dL - - -  Total Bilirubin 0.3 - 1.2 mg/dL - - -  Alkaline Phos 25 - 125 46 51 56  AST  14 - 40 33 34 30  ALT 10 - 40 _0 Lab Results  Component Value Date   PSA1 1.6 10/22/2020    STUDIES:   No current studies  HISTORY:   Allergies:  Allergies  Allergen Reactions  . Other Other (See Comments)    Grass Pollen     Current Medications: Current Outpatient Medications  Medication Sig Dispense Refill  . bicalutamide (CASODEX) 50 MG tablet Take 40 mg by mouth daily.    Marland Kitchen dexamethasone (DECADRON) 4 MG tablet Take 4 mg by mouth as directed.    . ondansetron (ZOFRAN) 4 MG tablet Take 4 mg by mouth every 8 (eight) hours as needed for nausea or vomiting.    Marland Kitchen oxyCODONE (OXY IR/ROXICODONE) 5 MG immediate release tablet Take 5 mg by mouth every 4 (four) hours as needed for severe pain.    . pantoprazole (PROTONIX) 40 MG tablet pantoprazole 40 mg tablet,delayed release  TAKE 1 TABLET BY MOUTH TWICE DAILY WITH BREAKFAST AND EVENING MEAL    . predniSONE (DELTASONE) 5 MG tablet Take 1 tablet by mouth twice daily 60 tablet 5  . prochlorperazine (COMPAZINE) 10 MG tablet Take 10 mg by mouth every 6 (six) hours as needed for nausea or vomiting.    . temazepam (RESTORIL) 15 MG capsule  Take 15 mg by mouth at bedtime as needed for sleep.     No current facility-administered medications for this visit.     ASSESSMENT & PLAN:   Assessment: 1.  Stage III prostate cancer diagnosed in April 2019 who was treated with radical prostatectomy but had positive margins and involvement of the seminal vesicles as well as a Gleason 9 score and lymphovascular invasion.  He was seen by Dr. Orlene Erm who recommended adjuvant radiation therapy, which he finally completed a total cumulative dose of 7940 cGy in September 2020.  He continues Lupron injections every 3-4 months and was placed on bicalutamide in March 2021.  His PSA has continued to decrease and most recently was 0.3 at Dr. Ara Kussmaul office.  2.  Liver metastases, biopsy proven to be metastatic from his prostate cancer.   He is receiving palliative docetaxel/prednisone.  Most recent CT imaging revealed improvement in all 3 liver lesions.  A 4th liver lesion may represent a cyst.  3.  Macrocytic anemia, which has slightly worsened. Repeat B12 and folate were normal  4.  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  5. Mild neutropenia, we will still proceed with chemotherapy and continue to monitor this.   6. Recent respiratory infection, which may have represented COVID-19.  The patient seems to have fully recovered.  Plan:    He will proceed with a 13th cycle of docetaxel on March 9th.  He knows to continue his prednisone 5 mg daily.  We will see him back in 3 weeks with CBC, comprehensive metabolic panel and CT chest, abdomen and pelvis to reassess his disease baseline prior to a potential 14th cycle of docetaxel.  The patient understands the plans discussed today and is in agreement with them.  The patient knows to contact our office if he has issues requiring immediate clinical assessment.   [facility] [address] [place]  [address] Dept: [phone] Dept Fax: [phone]   I, Rita Ohara, am acting as scribe for Derwood Kaplan, MD  I have reviewed this report as typed by the  medical scribe, and it is complete and accurate.

## 2021-02-11 ENCOUNTER — Inpatient Hospital Stay: Payer: Medicaid Other

## 2021-02-11 ENCOUNTER — Inpatient Hospital Stay: Payer: Medicaid Other | Admitting: Oncology

## 2021-02-13 ENCOUNTER — Inpatient Hospital Stay: Payer: Medicaid Other

## 2021-02-15 ENCOUNTER — Inpatient Hospital Stay: Payer: Medicaid Other

## 2021-02-18 ENCOUNTER — Ambulatory Visit (HOSPITAL_COMMUNITY): Payer: Medicaid Other

## 2021-02-18 ENCOUNTER — Ambulatory Visit (HOSPITAL_COMMUNITY)
Admission: RE | Admit: 2021-02-18 | Discharge: 2021-02-18 | Disposition: A | Discharge status: Home / Self Care | Payer: Medicaid Other | Source: Ambulatory Visit | Attending: Hematology and Oncology | Admitting: Hematology and Oncology

## 2021-02-18 ENCOUNTER — Other Ambulatory Visit: Payer: Self-pay

## 2021-02-18 DIAGNOSIS — C61 Malignant neoplasm of prostate: Secondary | ICD-10-CM | POA: Diagnosis not present

## 2021-02-18 DIAGNOSIS — C787 Secondary malignant neoplasm of liver and intrahepatic bile duct: Secondary | ICD-10-CM

## 2021-02-18 MED ORDER — IOHEXOL 300 MG/ML  SOLN
100.0000 mL | Freq: Once | INTRAMUSCULAR | Status: AC | PRN
  Administered 2021-02-18: 100 mL via INTRAVENOUS

## 2021-02-19 NOTE — Progress Notes (Signed)
[facility]  [address] [place],  [place]  [address] [phone]  Clinic Day:  02/20/2021  Referring physician: Reita May, NP   This document serves as a record of services personally performed by [name], MD. It was created on their behalf by [name],[name], a trained medical scribe. The creation of this record is based on the scribe's personal observations and the provider's statements to them.  CHIEF COMPLAINT:  CC:   Metastatic prostate cancer including spread of his disease to his liver.  Current Treatment:   Docetaxel/prednisone, in addition to Lupron every 4 months.   HISTORY OF PRESENT ILLNESS:  Jeffrey Lambert is a 65 y.o. 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  He had symptoms of urinary frequency and a steadily rising PSA.  Biopsy confirmed prostatic adenocarcinoma with a Gleason 8, 4+ 4, in 100% of the biopsy submitted.  He also had cystoscopy at that time, which was benign.  He has had hematuria and and hematospermia in the past.  CT imaging in May revealed mild prostatic enlargement and a bone scan was negative for metastatic disease.  He underwent radical prostatectomy and bilateral pelvic lymphadenectomy in May 2019.  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  There was extraprostatic extension at bilateral apices and positive margins at the left posterior lateral wall.  There was also infiltration of the seminal vesicles.  The tumor volume was approximately 50% of the gland.  Bilateral lymph nodes were clear.  He had a weight loss of 80 lb in the perioperative period.  Due to his aggressive prostate cancer and family history of malignancy, he underwent testing for hereditary cancer syndromes with the [employer] myRisk Hereditary Cancer Panel.  This did not reveal any clinically significant mutation or variants of uncertain significance.  He  was recommended for adjuvant radiation therapy.  He received 2720 cGy for 15 fractions and then stopped in August 2019.  By January 2020, he had 1 treatment and in May 2020 he went back on daily treatment and had the other 5220 cGy in 29 fractions for a total cumulative dose of 7940 cGy.  PET scan in July 2020 revealed mild hypermetabolic activity with soft tissue thickening in the deep pelvis felt to be postsurgical in nature.  There was no evidence of nodal metastasis or distant metastasis.  CT abdomen and pelvis done in the emergency room August 2020 revealed two hypodense liver lesions, new compared to imaging from 2019, ranging from 16-25 mm, which were suspicious for liver metastases, but were PET negative the month prior.    He continued to follow with Dr. Nila Nephew and remained on Lupron injections every 4 months. The PSA was up 26.1 in January.  Bicalutamide was added in March.  The PSA had decreased to 9.7 in April.  We began seeing him again in May 2021, and the PSA was stable at 9.6.  We recommended CT imaging and bone scan.  CT abdomen and pelvis on May 20th revealed interval progression of liver metastases with the largest measuring 3 cm.  Ultrasound guided biopsy from June 10th revealed metastatic carcinoma consistent with the patient's known primary prostatic cancer.  Whole body bone scan from May 20th was negative for metastatic disease, but revealed degenerative changes throughout the cervical spine, as well as within the bilateral shoulders and bilateral knees.  Due to his liver metastasis, we recommended palliative IV chemotherapy  with docetaxel, in addition to prednisone 5 twice daily.  He had insomnia due to additional dexamethasone premedication prior to chemotherapy, so the afternoon dose of his dexamethasone was discontinued with improvement in his muscle weakness. He continued his hormonal therapy.  CT chest, abdomen and pelvis from September 7th revealed stable right lower lobe pulmonary  nodules with no new or progressive findings.  There  Was interval decrease an size of the hepatic metastatic lesions.  The largest of these lesions previously measuring 30 mm, now measure 16 mm.  No new lesions or abdominal/pelvic lymphadenopathy was observed.  There were no findings of osseous metastatic disease.  On October 25th, his PSA had decreased to 1.8, and down to 1.6 in November.  CT abdomen and pelvis from December 6th revealed improvement in the hepatic metastasis with the largest lesion now measuring 1.7 cm, previously 1.9 cm.  No abdominopelvic adenopathy or evidence of extrahepatic metastatic disease was observed.  He has completed 12 cycles as of February 2022.  His latest PSA with Dr. Nila Nephew was 0.38.    INTERVAL HISTORY:  Jeffrey Lambert is here for follow up of restaging scans as he has completed 12 cycles of Taxotere.  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  His symptoms have started to improve.  CT chest, abdomen and pelvis from March 14th revealed a stable exam with no evidence of locally recurrent mass or lymphadenopathy.  The multiple low-attenuation metastatic lesions of the liver are not significantly changed compared to prior examination.  There is no evidence of new metastatic disease in the chest, abdomen, or pelvis, or evidence of osseous metastatic disease.  His last PSA at his urologist, Dr. Nila Nephew, was 0.38.  His hemoglobin has mildly improved from 11.9 to 12.3, and his white count and platelets are normal.  Chemistries are unremarkable.  His  appetite is good, and he has lost 3 pounds since his last visit.  He denies fever, chills or other signs of infection.  He denies nausea, vomiting, bowel issues, or abdominal pain.  He denies sore throat, cough, dyspnea, or chest pain.  REVIEW OF SYSTEMS:  Review of Systems  Constitutional: Negative.  Negative for appetite change, chills, fatigue, fever and unexpected weight  change.  HENT:  Negative.   Eyes: Negative.   Respiratory: Negative.  Negative for chest tightness, cough, hemoptysis, shortness of breath and wheezing.   Cardiovascular: Negative.  Negative for chest pain, leg swelling and palpitations.  Gastrointestinal: Negative.  Negative for abdominal distention, abdominal pain, blood in stool, constipation, diarrhea, nausea and vomiting.  Endocrine: Negative.   Genitourinary: Negative.  Negative for difficulty urinating, dysuria, frequency and hematuria.   Musculoskeletal: Negative for arthralgias, back pain, flank pain, gait problem and myalgias.       Pain and weakness of the proximal muscles of the lower extremities, consistent with steroid myopathy.  Skin: Negative.   Neurological: Negative.  Negative for dizziness, extremity weakness, gait problem, headaches, light-headedness, numbness, seizures and speech difficulty.  Hematological: Negative.   Psychiatric/Behavioral: Negative.  Negative for depression and sleep disturbance. The patient is not nervous/anxious.   All other systems reviewed and are negative.    VITALS:  Blood pressure (!) 148/93, pulse 77, temperature 97.8 F (36.6 C), temperature source Oral, resp. rate 18, height 5' 7"  (1.702 m), weight 149 lb 3.2 oz (67.7 kg), SpO2 97 %.  Wt Readings from Last 3 Encounters:  02/20/21 149 lb 3.2 oz (  67.7 kg)  01/25/21 150 lb 4 oz (68.2 kg)  01/23/21 149 lb 8 oz (67.8 kg)    Body mass index is 23.37 kg/m.  Performance status (ECOG): 1 - Symptomatic but completely ambulatory  PHYSICAL EXAM:  Physical Exam Constitutional:      General: He is not in acute distress.    Appearance: Normal appearance. He is normal weight.  HENT:     Head: Normocephalic and atraumatic.  Eyes:     General: No scleral icterus.    Extraocular Movements: Extraocular movements intact.     Conjunctiva/sclera: Conjunctivae normal.     Pupils: Pupils are equal, round, and reactive to light.  Cardiovascular:      Rate and Rhythm: Normal rate and regular rhythm.     Pulses: Normal pulses.     Heart sounds: Normal heart sounds. No murmur heard. No friction rub. No gallop.   Pulmonary:     Effort: Pulmonary effort is normal. No respiratory distress.     Breath sounds: Normal breath sounds.  Abdominal:     General: Bowel sounds are normal. There is no distension.     Palpations: Abdomen is soft. There is no hepatomegaly, splenomegaly or mass.     Tenderness: There is no abdominal tenderness.  Musculoskeletal:        General: Normal range of motion.     Cervical back: Normal range of motion and neck supple.     Right lower leg: No edema.     Left lower leg: No edema.  Lymphadenopathy:     Cervical: No cervical adenopathy.  Skin:    General: Skin is warm and dry.  Neurological:     General: No focal deficit present.     Mental Status: He is alert and oriented to person, place, and time. Mental status is at baseline.  Psychiatric:        Mood and Affect: Mood normal.        Behavior: Behavior normal.        Thought Content: Thought content normal.        Judgment: Judgment normal.    LABS:   CBC Latest Ref Rng & Units 01/21/2021 12/31/2020 12/03/2020  WBC - 3.0 6.5 5.3  Hemoglobin 13.5 - 17.5 11.9(A) 12.4(A) 12.2(A)  Hematocrit 41 - 53 35(A) 37(A) 36(A)  Platelets 150 - 399 222 339 212   CMP Latest Ref Rng & Units 01/21/2021 12/31/2020 12/03/2020  Glucose 70 - 99 mg/dL - - -  BUN 4 - 21 14 14 13   Creatinine 0.6 - 1.3 0.8 1.0 0.9  Sodium 137 - 147 137 139 138  Potassium 3.4 - 5.3 3.9 4.1 3.7  Chloride 99 - 108 105 104 101  CO2 13 - 22 25(A) 26(A) 27(A)  Calcium 8.7 - 10.7 9.0 9.1 9.1  Total Protein 6.5 - 8.1 g/dL - - -  Total Bilirubin 0.3 - 1.2 mg/dL - - -  Alkaline Phos 25 - 125 46 51 56  AST 14 - 40 33 34 30  ALT 10 - 40 24 27 20       Lab Results  Component Value Date   PSA1 1.6 10/22/2020    STUDIES:   EXAM: 02/18/2021 CT CHEST WITH CONTRAST  CT ABDOMEN AND PELVIS  WITH AND WITHOUT CONTRAST  TECHNIQUE: Multidetector CT imaging of the chest was performed during intravenous contrast administration. Multidetector CT imaging of the abdomen and pelvis was performed following the standard protocol before and during bolus administration of intravenous  contrast.  CONTRAST:  127m OMNIPAQUE IOHEXOL 300 MG/ML SOLN, additional oral enteric contrast  COMPARISON:  CT abdomen pelvis, 11/12/2020, CT chest abdomen pelvis, 08/14/2020  FINDINGS: CT CHEST FINDINGS  Cardiovascular: Right chest port catheter. Aortic atherosclerosis. Normal heart size. Three-vessel coronary artery calcifications. No pericardial effusion.  Mediastinum/Nodes: No enlarged mediastinal, hilar, or axillary lymph nodes. Thyroid gland, trachea, and esophagus demonstrate no significant findings.  Lungs/Pleura: Lungs are clear. No pleural effusion or pneumothorax.  Musculoskeletal: No chest wall mass or suspicious bone lesions identified.  CT ABDOMEN PELVIS FINDINGS  Hepatobiliary: No solid liver abnormality is seen. Multiple low-attenuation metastatic lesions of the liver are not significantly changed compared to prior examination, an index lesion of the peripheral inferior right lobe of the liver measuring 1.9 x 1.4 cm (series 7, image 116). No gallstones, gallbladder wall thickening, or biliary dilatation.  Pancreas: Unremarkable. No pancreatic ductal dilatation or surrounding inflammatory changes.  Spleen: Normal in size without significant abnormality.  Adrenals/Urinary Tract: Adrenal glands are unremarkable. Kidneys are normal, without renal calculi, solid lesion, or hydronephrosis. Thickening of the urinary bladder.  Stomach/Bowel: Stomach is within normal limits. Appendix appears normal. No evidence of bowel wall thickening, distention, or inflammatory changes.  Vascular/Lymphatic: Aortic atherosclerosis. No enlarged abdominal or pelvic lymph  nodes.  Reproductive: Status post prostatectomy.  Other: No abdominal wall hernia or abnormality. No abdominopelvic ascites.  Musculoskeletal: No acute or significant osseous findings.  IMPRESSION: 1. Status post prostatectomy. No evidence of locally recurrent mass or lymphadenopathy. 2. Multiple low-attenuation metastatic lesions of the liver are not significantly changed compared to prior examination. 3. No evidence of new metastatic disease in the chest, abdomen, or pelvis. 4. No CT evidence of osseous metastatic disease. 5. Coronary artery disease.  Aortic Atherosclerosis (ICD10-I70.0).   HISTORY:   Allergies:  Allergies  Allergen Reactions  . Other Other (See Comments)    Grass Pollen     Current Medications: Current Outpatient Medications  Medication Sig Dispense Refill  . bicalutamide (CASODEX) 50 MG tablet Take 40 mg by mouth daily.    . ondansetron (ZOFRAN) 4 MG tablet Take 4 mg by mouth every 8 (eight) hours as needed for nausea or vomiting.    .Marland KitchenoxyCODONE (OXY IR/ROXICODONE) 5 MG immediate release tablet Take 5 mg by mouth every 4 (four) hours as needed for severe pain.    . pantoprazole (PROTONIX) 40 MG tablet pantoprazole 40 mg tablet,delayed release  TAKE 1 TABLET BY MOUTH TWICE DAILY WITH BREAKFAST AND EVENING MEAL    . predniSONE (DELTASONE) 5 MG tablet Take 1 tablet by mouth twice daily (Patient taking differently: Take 5 mg by mouth daily with breakfast. HAVE NOT TAKEN IT IN 4 DAYS DUE TO LEG PAIN) 60 tablet 5  . prochlorperazine (COMPAZINE) 10 MG tablet Take 10 mg by mouth every 6 (six) hours as needed for nausea or vomiting.    . temazepam (RESTORIL) 15 MG capsule Take 15 mg by mouth at bedtime as needed for sleep.    . traZODone (DESYREL) 50 MG tablet      No current facility-administered medications for this visit.     ASSESSMENT & PLAN:   Assessment: 1.  Stage III prostate cancer diagnosed in April 2019 who was treated with radical  prostatectomy but had positive margins and involvement of the seminal vesicles as well as a Gleason 9 score and lymphovascular invasion.  He was seen by Dr. POrlene Ermwho recommended adjuvant radiation therapy, which he finally completed a total cumulative dose of  7940 cGy in September 2020.  He continues Lupron injections every 3-4 months and was placed on bicalutamide in March 2021.    2.  Liver metastases diagnosed in August 2020, biopsy proven to be metastatic from his prostate cancer.   He has received 12 cycles of docetaxel/prednisone.  CT imaging initially showed improvement in all 3 liver lesions, and CT imaging in March remains stable.  We will plan to discontinue this therapy as he has received 12 doses.  His PSA has continued to decrease and most recently was 0.3 at Dr. Ara Kussmaul office.  3.  Macrocytic anemia, which has slightly improved. Repeat B12 and folate were normal  4.  Steroid myopathy, improved with holding prednisone.  This should continue to improve as we are discontinuing docetaxel/prednisone.  Plan:    As he has received 12 doses of docetaxel we will plan to discontinue this therapy as well as prednisone.  This should also improve his steroid myopathy.  He knows to continue bicalutamide.  We will see him back in 4 weeks with CBC, comprehensive metabolic panel and PSA for repeat evaluation.  If and when he has progressive disease, we will most likely go to enzalutamide next. The patient understands the plans discussed today and is in agreement with them.  The patient knows to contact our office if he has issues requiring immediate clinical assessment.   [facility] [address] [place] [address] Dept: [phone] Dept Fax: [phone]    I, Rita Ohara, am acting as scribe for Derwood Kaplan, MD  I have reviewed this report as typed by the medical scribe, and it is complete and accurate.

## 2021-02-20 ENCOUNTER — Encounter: Payer: Self-pay | Admitting: Oncology

## 2021-02-20 ENCOUNTER — Other Ambulatory Visit: Payer: Self-pay | Admitting: Oncology

## 2021-02-20 ENCOUNTER — Inpatient Hospital Stay: Payer: Medicaid Other | Attending: Oncology

## 2021-02-20 ENCOUNTER — Inpatient Hospital Stay (INDEPENDENT_AMBULATORY_CARE_PROVIDER_SITE_OTHER): Payer: Medicaid Other | Admitting: Oncology

## 2021-02-20 ENCOUNTER — Telehealth: Payer: Self-pay | Admitting: Oncology

## 2021-02-20 ENCOUNTER — Other Ambulatory Visit: Payer: Self-pay | Admitting: Hematology and Oncology

## 2021-02-20 ENCOUNTER — Other Ambulatory Visit: Payer: Self-pay

## 2021-02-20 VITALS — BP 148/93 | HR 77 | Temp 97.8°F | Resp 18 | Ht 67.0 in | Wt 149.2 lb

## 2021-02-20 DIAGNOSIS — C61 Malignant neoplasm of prostate: Secondary | ICD-10-CM | POA: Diagnosis not present

## 2021-02-20 LAB — CBC
MCV: 96 — AB (ref 80–94)
RBC: 3.79 — AB (ref 3.87–5.11)

## 2021-02-20 LAB — COMPREHENSIVE METABOLIC PANEL
Albumin: 4.3 (ref 3.5–5.0)
Calcium: 9.7 (ref 8.7–10.7)

## 2021-02-20 LAB — CBC AND DIFFERENTIAL
HCT: 37 — AB (ref 41–53)
Hemoglobin: 12.3 — AB (ref 13.5–17.5)
Neutrophils Absolute: 2.27
Platelets: 239 (ref 150–399)
WBC: 4.2

## 2021-02-20 LAB — BASIC METABOLIC PANEL
BUN: 16 (ref 4–21)
CO2: 27 — AB (ref 13–22)
Chloride: 106 (ref 99–108)
Creatinine: 1 (ref 0.6–1.3)
Glucose: 88
Potassium: 4.2 (ref 3.4–5.3)
Sodium: 140 (ref 137–147)

## 2021-02-20 LAB — HEPATIC FUNCTION PANEL
ALT: 23 (ref 10–40)
AST: 35 (ref 14–40)
Alkaline Phosphatase: 49 (ref 25–125)
Bilirubin, Total: 0.6

## 2021-02-20 NOTE — Telephone Encounter (Signed)
Per 3/16 LOS, patient scheduled for April Appt's.  Gave patient Appt Summary 

## 2021-02-22 ENCOUNTER — Other Ambulatory Visit: Payer: Self-pay

## 2021-03-20 ENCOUNTER — Other Ambulatory Visit: Payer: Self-pay

## 2021-03-20 ENCOUNTER — Inpatient Hospital Stay (INDEPENDENT_AMBULATORY_CARE_PROVIDER_SITE_OTHER): Payer: Medicaid Other | Admitting: Hematology and Oncology

## 2021-03-20 ENCOUNTER — Inpatient Hospital Stay: Payer: Medicaid Other | Attending: Oncology

## 2021-03-20 ENCOUNTER — Encounter: Payer: Self-pay | Admitting: Hematology and Oncology

## 2021-03-20 ENCOUNTER — Other Ambulatory Visit: Payer: Self-pay | Admitting: Hematology and Oncology

## 2021-03-20 ENCOUNTER — Telehealth: Payer: Self-pay | Admitting: Hematology and Oncology

## 2021-03-20 VITALS — BP 142/76 | HR 60 | Temp 97.7°F | Resp 20 | Ht 67.0 in | Wt 144.0 lb

## 2021-03-20 DIAGNOSIS — C61 Malignant neoplasm of prostate: Secondary | ICD-10-CM

## 2021-03-20 DIAGNOSIS — C787 Secondary malignant neoplasm of liver and intrahepatic bile duct: Secondary | ICD-10-CM | POA: Insufficient documentation

## 2021-03-20 LAB — BASIC METABOLIC PANEL
BUN: 18 (ref 4–21)
CO2: 22 (ref 13–22)
Chloride: 109 — AB (ref 99–108)
Creatinine: 0.8 (ref 0.6–1.3)
Glucose: 100
Potassium: 4 (ref 3.4–5.3)
Sodium: 136 — AB (ref 137–147)

## 2021-03-20 LAB — HEPATIC FUNCTION PANEL
ALT: 23 (ref 10–40)
AST: 26 (ref 14–40)
Alkaline Phosphatase: 57 (ref 25–125)
Bilirubin, Total: 0.5

## 2021-03-20 LAB — CBC
MCV: 95 — AB (ref 80–94)
RBC: 4.13 (ref 3.87–5.11)

## 2021-03-20 LAB — CBC AND DIFFERENTIAL
HCT: 39 — AB (ref 41–53)
Hemoglobin: 13.3 — AB (ref 13.5–17.5)
Neutrophils Absolute: 2.04
Platelets: 196 (ref 150–399)
WBC: 4

## 2021-03-20 LAB — COMPREHENSIVE METABOLIC PANEL
Albumin: 4.2 (ref 3.5–5.0)
Calcium: 8.6 — AB (ref 8.7–10.7)

## 2021-03-20 LAB — PSA: Prostatic Specific Antigen: 0.64 ng/mL (ref 0.00–4.00)

## 2021-03-20 NOTE — Telephone Encounter (Signed)
Per 4/13 los next appt scheduled and given to patient

## 2021-03-20 NOTE — Progress Notes (Signed)
[facility]  [address] [place],  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  He had symptoms of urinary frequency and a steadily rising PSA.  Biopsy confirmed prostatic adenocarcinoma with a Gleason 8, 4+ 4, in 100% of the biopsy submitted.  He also had cystoscopy at that time, which was benign.  He has had hematuria and and hematospermia in the past.  CT imaging in May revealed mild prostatic enlargement and a bone scan was negative for metastatic disease.  He underwent radical prostatectomy and bilateral pelvic lymphadenectomy in May 2019.  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  There was extraprostatic extension at bilateral apices and positive margins at the left posterior lateral wall.  There was also infiltration of the seminal vesicles.  The tumor volume was approximately 50% of the gland.  Bilateral lymph nodes were clear.  He had a weight loss of 80 lb in the perioperative period.  Due to his aggressive prostate cancer and family history of malignancy, he underwent testing for hereditary cancer syndromes with the [employer] myRisk Hereditary Cancer Panel.  This did not reveal any clinically significant mutation or variants of uncertain significance.    He was recommended for adjuvant radiation therapy.  He received 2720 cGy for 15 fractions and then stopped in August 2019.  In January 2020, he came in for 1 treatment and in May 2020 he went back on daily treatment and had the remaining 5220 cGy in 29 fractions for a total cumulative dose of 7940 cGy. PET scan in July 2020  revealed mild hypermetabolic activity with soft tissue thickening in the deep pelvis felt to be postsurgical in nature.  There was no evidence of nodal metastasis or distant metastasis.  CT abdomen and pelvis done in the emergency room August 2020 revealed two hypodense liver lesions, new compared to imaging from 2019, ranging from 16-25 mm, which were suspicious for liver metastases, but were PET negative the month prior.    He continued to follow with Dr. Nila Nephew and remained on Lupron injections every 4 months. The PSA was up 26.1 in January 2021.  Bicalutamide was added in March.  The PSA had decreased to 9.7 in April.  We began seeing him again in May 2021 at which time the PSA was stable at 9.6.  We recommended CT imaging and bone scan.  CT abdomen and pelvis revealed interval progression of liver metastases with the largest measuring 3 cm. Bone scan was negative for metastatic disease, but revealed degenerative changes throughout the cervical spine, as well as within the bilateral shoulders and bilateral knees. Ultrasound guided biopsy of the liver in June revealed metastatic carcinoma consistent with the patient's known primary prostatic cancer. He continued his hormonal therapy.  CT chest, abdomen and pelvis in September revealed stable right lower lobe pulmonary nodules with no new or progressive findings. There was interval decrease an size of the hepatic metastatic lesions.  The largest of these lesions previously measuring 30 mm, now measure 16 mm.  No new lesions or abdominal/pelvic lymphadenopathy was observed.  There were no findings of osseous metastatic disease.  The PSA steadily.  CT abdomen and pelvis in December revealed mild improvement in the hepatic metastasis with the largest lesion now measuring 1.7 cm, previously 1.9 cm.  No abdominopelvic adenopathy or evidence of extrahepatic metastatic disease was observed.    He completed 12 cycles of palliative docetaxel chemotherapy in February. His  latest PSA with Dr. Chao was 0.38. CT chest, abdomen and pelvis in March did reveal any evidence of locally recurrent mass or lymphadenopathy. The multiple low-attenuation metastatic lesions of the liver were not significantly changed compared to prior examination. There was no evidence of new metastatic disease in the chest, abdomen, or pelvis or CT evidence of osseous metastatic disease.  INTERVAL HISTORY:  Jeffrey Lambert is here today for repeat clinical assessment. Overall, he continues to do well and denies complaints.  He continues bicalutamide daily without difficulty.  He continues leuprolide injections with Dr. Chao.  He denies fevers or chills. He denies pain. His appetite is good. His weight has decreased 2 pounds over last 1 month. He states he was recently on prednisone pack for allergies, but he did not complete it as she developed severe toxicities of flushing, dizziness and irritability.  REVIEW OF SYSTEMS:  Review of Systems  Constitutional: Negative for appetite change, chills, fatigue, fever and unexpected weight change.  HENT:   Negative for lump/mass, mouth sores and sore throat.   Respiratory: Negative for cough and shortness of breath.   Cardiovascular: Negative for chest pain and leg swelling.  Gastrointestinal: Negative for abdominal pain, constipation, diarrhea, nausea and vomiting.  Genitourinary: Negative for difficulty urinating, dysuria, frequency and hematuria.   Musculoskeletal: Negative for arthralgias, back pain and myalgias.  Skin: Negative for itching, rash and wound.  Neurological: Negative for dizziness, extremity weakness, headaches, light-headedness and numbness.  Hematological: Negative for adenopathy.  Psychiatric/Behavioral: Negative for depression and sleep disturbance. The patient is not nervous/anxious.      VITALS:  Blood pressure (!) 142/76, pulse 60, temperature 97.7 F (36.5 C), temperature source Oral, resp. rate 20, height 5' 7" (1.702 m), weight 144  lb (65.3 kg), SpO2 98 %.  Wt Readings from Last 3 Encounters:  03/20/21 144 lb (65.3 kg)  02/20/21 149 lb 3.2 oz (67.7 kg)  01/25/21 150 lb 4 oz (68.2 kg)    Body mass index is 22.55 kg/m.  Performance status (ECOG): 0 - Asymptomatic  PHYSICAL EXAM:  Physical Exam Vitals and nursing note reviewed.  Constitutional:      General: He is not in acute distress.    Appearance: Normal appearance. He is normal weight.  HENT:     Head: Normocephalic and atraumatic.     Mouth/Throat:     Mouth: Mucous membranes are moist.     Pharynx: Oropharynx is clear. No oropharyngeal exudate or posterior oropharyngeal erythema.  Eyes:     General: No scleral icterus.    Extraocular Movements: Extraocular movements intact.     Conjunctiva/sclera: Conjunctivae normal.     Pupils: Pupils are equal, round, and reactive to light.  Cardiovascular:     Rate and Rhythm: Normal rate and regular rhythm.     Heart sounds: Normal heart sounds. No murmur heard. No friction rub. No gallop.   Pulmonary:     Effort: Pulmonary effort is normal.     Breath sounds: Normal breath sounds. No wheezing, rhonchi or rales.  Chest:  Breasts:     Right: No axillary adenopathy or supraclavicular adenopathy.     Left: No axillary adenopathy or supraclavicular adenopathy.      Abdominal:     General: Bowel sounds are normal. There is no distension.     Palpations: Abdomen is soft. There is no hepatomegaly, splenomegaly or mass.     Tenderness: There is no abdominal tenderness.  Musculoskeletal:        General: Normal range of motion.     Cervical back: Normal range of motion and neck supple. No tenderness.     Right lower leg: No edema.     Left lower leg: No edema.  Lymphadenopathy:     Cervical: No cervical adenopathy.     Upper Body:     Right upper body: No supraclavicular or axillary adenopathy.     Left upper body: No supraclavicular or axillary adenopathy.     Lower Body: No right inguinal adenopathy. No left  inguinal adenopathy.  Skin:    General: Skin is warm and dry.     Coloration: Skin is not jaundiced.     Findings: No rash.  Neurological:     Mental Status: He is alert and oriented to person, place, and time.     Cranial Nerves: No cranial nerve deficit.  Psychiatric:        Mood and Affect: Mood normal.        Behavior: Behavior normal.        Thought Content: Thought content normal.    LABS:   CBC Latest Ref Rng & Units 03/20/2021 02/20/2021 01/21/2021  WBC - 4.0 4.2 3.0  Hemoglobin 13.5 - 17.5 13.3(A) 12.3(A) 11.9(A)  Hematocrit 41 - 53 39(A) 37(A) 35(A)  Platelets 150 - 399 196 239 222   CMP Latest Ref Rng & Units 03/20/2021 02/20/2021 01/21/2021  Glucose 70 - 99 mg/dL - - -  BUN 4 - _0 Creatinine 0.6 - 1.3 0.8 1.0 0.8  Sodium 137 - 147 136(A) 140 137  Potassium 3.4 - 5.3 4.0 4.2 3.9  Chloride 99 - 108 109(A) 106 105  CO2 13 - 22 22 27(A) 25(A)  Calcium 8.7 - 10.7 8.6(A) 9.7 9.0  Total Protein 6.5 - 8.1 g/dL - - -  Total Bilirubin 0.3 - 1.2 mg/dL - - -  Alkaline Phos 25 - 125 57 49 46  AST 14 - 40 26 35 33  ALT 10 - 40 _1 PSA is pending from today.  No results found for: CEA1 / No results found for: CEA1 Lab Results  Component Value Date   PSA1 1.6 10/22/2020   No results found for: QGB201 No results found for: CAN125  No results found for: TOTALPROTELP, ALBUMINELP, A1GS, A2GS, BETS, BETA2SER, GAMS, MSPIKE, SPEI No results found for: TIBC, FERRITIN, IRONPCTSAT No results found for: LDH  STUDIES:  No results found.    HISTORY:   Past Medical History:  Diagnosis Date  . Allergy   . Depression   . Dupuytren's contracture   . GERD (gastroesophageal reflux disease)   . Malignant neoplasm of prostate ([place])   . Osteoarthritis   . Secondary malignant neoplasm of liver ([place])   . Secondary malignant neoplasm of liver [facility])     Past Surgical History:  Procedure Laterality Date  . APPENDECTOMY  1970  . HERNIA REPAIR  03/2020  .  PROSTATECTOMY  2019    Family History  Problem Relation Age of Onset  . Colon cancer Brother 70  . Cancer Maternal Uncle   . Colon cancer Maternal Grandfather 67    Social History:  reports that he  has quit smoking. He has never used smokeless tobacco. He reports that he does not drink alcohol. No history on file for drug use.The patient is alone today.  Allergies:  Allergies  Allergen Reactions  . Other Other (See Comments)    Grass Pollen     Current Medications: Current Outpatient Medications  Medication Sig Dispense Refill  . bicalutamide (CASODEX) 50 MG tablet Take 40 mg by mouth daily.    . ondansetron (ZOFRAN) 4 MG tablet Take 4 mg by mouth every 8 (eight) hours as needed for nausea or vomiting.    . oxyCODONE (OXY IR/ROXICODONE) 5 MG immediate release tablet Take 5 mg by mouth every 4 (four) hours as needed for severe pain.    . pantoprazole (PROTONIX) 40 MG tablet pantoprazole 40 mg tablet,delayed release  TAKE 1 TABLET BY MOUTH TWICE DAILY WITH BREAKFAST AND EVENING MEAL    . prochlorperazine (COMPAZINE) 10 MG tablet Take 10 mg by mouth every 6 (six) hours as needed for nausea or vomiting.    . temazepam (RESTORIL) 15 MG capsule Take 15 mg by mouth at bedtime as needed for sleep.    . traZODone (DESYREL) 50 MG tablet      No current facility-administered medications for this visit.     ASSESSMENT & PLAN:   Assessment:  1.  Stage III prostate cancer diagnosed in April 2019 treated with radical prostatectomy and adjuvant radiation therapy.  He has continued leuprolide injections every 3 months, as well as bicalutamide in March 2021.    2.  Liver metastases, biopsy proven to be metastatic from his prostate cancer.   He received 12 cycles of docetaxel/prednisone. Recent CT imaging in March was stable.   He continues bicalutamide and leuprolide.  3.  Macrocytic anemia, which continues to improve.  4.  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   Plan:  We  will plan to see him back in 4 weeks with a CBC, comprehensive metabolic panel and PSA for continued supportive care. The patient understands the plans discussed today and is in agreement with them.  He knows to contact our office if he develops concerns prior to his next appointment.     Kelli A Mosher, PA-C       

## 2021-03-27 ENCOUNTER — Telehealth: Payer: Self-pay

## 2021-03-27 NOTE — Telephone Encounter (Addendum)
Pt notified of below and verbalized understanding.  ----- Message from Marvia Pickles, PA-C sent at 03/27/2021  8:22 AM EDT ----- Regarding: RE: PSA results Contact: [phone] Still less than 1, so good. Thanks! ----- Message ----- From: Dairl Ponder, RN Sent: 03/26/2021   2:07 PM EDT To: Marvia Pickles, PA-C Subject: PSA results                                    Pt called to ask about his PSA results.

## 2021-04-17 ENCOUNTER — Inpatient Hospital Stay: Payer: Medicaid Other | Attending: Oncology | Admitting: Hematology and Oncology

## 2021-04-17 ENCOUNTER — Inpatient Hospital Stay: Payer: Medicaid Other

## 2021-04-17 ENCOUNTER — Other Ambulatory Visit: Payer: Self-pay

## 2021-04-17 ENCOUNTER — Encounter: Payer: Self-pay | Admitting: Hematology and Oncology

## 2021-04-17 ENCOUNTER — Telehealth: Payer: Self-pay | Admitting: Hematology and Oncology

## 2021-04-17 VITALS — BP 139/74 | HR 67 | Temp 98.4°F | Resp 16 | Ht 67.0 in | Wt 139.0 lb

## 2021-04-17 DIAGNOSIS — C61 Malignant neoplasm of prostate: Secondary | ICD-10-CM | POA: Diagnosis not present

## 2021-04-17 DIAGNOSIS — C787 Secondary malignant neoplasm of liver and intrahepatic bile duct: Secondary | ICD-10-CM | POA: Diagnosis not present

## 2021-04-17 LAB — BASIC METABOLIC PANEL
BUN: 13 (ref 4–21)
CO2: 23 — AB (ref 13–22)
Chloride: 107 (ref 99–108)
Creatinine: 0.8 (ref 0.6–1.3)
Glucose: 115
Potassium: 3.8 (ref 3.4–5.3)
Sodium: 139 (ref 137–147)

## 2021-04-17 LAB — COMPREHENSIVE METABOLIC PANEL
Albumin: 4.3 (ref 3.5–5.0)
Calcium: 8.7 (ref 8.7–10.7)

## 2021-04-17 LAB — CBC AND DIFFERENTIAL
HCT: 39 — AB (ref 41–53)
Hemoglobin: 13.9 (ref 13.5–17.5)
Neutrophils Absolute: 2.21
Platelets: 209 (ref 150–399)
WBC: 4.8

## 2021-04-17 LAB — HEPATIC FUNCTION PANEL
ALT: 26 (ref 10–40)
AST: 31 (ref 14–40)
Alkaline Phosphatase: 64 (ref 25–125)
Bilirubin, Total: 0.6

## 2021-04-17 LAB — CBC: RBC: 4.22 (ref 3.87–5.11)

## 2021-04-17 NOTE — Telephone Encounter (Signed)
04/17/21 Spoke with patient and confirmed next appt

## 2021-04-17 NOTE — Progress Notes (Signed)
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  He had symptoms of urinary frequency and a steadily rising PSA.  Biopsy confirmed prostatic adenocarcinoma with a Gleason 8, 4+ 4, in 100% of the biopsy submitted.  He also had cystoscopy at that time, which was benign.  He has had hematuria and and hematospermia in the past.  CT imaging in May revealed mild prostatic enlargement and a bone scan was negative for metastatic disease.  He underwent radical prostatectomy and bilateral pelvic lymphadenectomy in May 2019.  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  There was extraprostatic extension at bilateral apices and positive margins at the left posterior lateral wall.  There was also infiltration of the seminal vesicles.  The tumor volume was approximately 50% of the gland.  Bilateral lymph nodes were clear.  He had a weight loss of 80 lb in the perioperative period.  Due to his aggressive prostate cancer and family history of malignancy, he underwent testing for hereditary cancer syndromes with the [employer] myRisk Hereditary Cancer Panel.  This did not reveal any clinically significant mutation or variants of uncertain significance.    He was recommended for adjuvant radiation therapy.  He received 2720 cGy for 15 fractions and then stopped in August 2019.  In January 2020, he came in for 1 treatment and in May 2020 he went back on daily treatment and had the remaining 5220 cGy in 29 fractions for a total cumulative dose of 7940 cGy. PET scan in July 2020  revealed mild hypermetabolic activity with soft tissue thickening in the deep pelvis felt to be postsurgical in nature.  There was no evidence of nodal metastasis or distant metastasis.  CT abdomen and pelvis done in the emergency room August 2020 revealed two hypodense liver lesions, new compared to imaging from 2019, ranging from 16-25 mm, which were suspicious for liver metastases, but were PET negative the month prior.    He continued to follow with Dr. Nila Nephew and remained on Lupron injections every 4 months. The PSA was up 26.1 in January 2021.  Bicalutamide was added in March.  The PSA had decreased to 9.7 in April.  We began seeing him again in May 2021, at which time the PSA was stable at 9.6.  We recommended CT imaging and bone scan.  CT abdomen and pelvis revealed interval progression of liver metastases with the largest measuring 3 cm. Bone scan was negative for metastatic disease, but revealed degenerative changes throughout the cervical spine, as well as within the bilateral shoulders and bilateral knees. Ultrasound guided biopsy of the liver in June revealed metastatic carcinoma consistent with the patient's known primary prostatic cancer. He continued his hormonal therapy.  We therefore recommended chemotherapy with docetaxel/prednisone, which he started in June.    CT chest, abdomen and pelvis in September revealed stable right lower lobe pulmonary nodules. There was an interval decrease an size of the hepatic metastatic lesions.  The largest of these lesions previously measuring 30 mm, now measure 16 mm.  No new lesions or abdominal/pelvic lymphadenopathy was observed.  There were no findings of osseous metastatic disease. The PSA steadily decreased.  CT abdomen and pelvis in December revealed mild improvement in the hepatic metastasis with the largest lesion now measuring 1.7 cm, previously 1.9 cm.  No abdominopelvic adenopathy or evidence of extrahepatic metastatic disease was observed.    He  completed 12 cycles of palliative docetaxel chemotherapy in February.  He reported his last PSA with Dr. Nila Nephew was 0.38. CT chest, abdomen and pelvis in March did reveal any evidence of locally recurrent mass or lymphadenopathy. The multiple low-attenuation metastatic lesions of the liver were not significantly changed compared to prior examination. There was no evidence of new metastatic disease in the chest, abdomen, or pelvis or CT evidence of osseous metastatic disease.  At his visit in April, had recently been placed on antibiotics, inhaler and prednisone pack for bronchitis, but he did not complete the prednisone as he developed severe toxicities of flushing, dizziness and irritability. He was otherwise doing well and the PSA was fairly stable at 0.64, previously 0.51.  INTERVAL HISTORY:  Jeffrey Lambert is here today for repeat clinical assessment.  He states he is feeling angry and depressed today. He states his quality of life is limited by his incontinence. He feels after all he went through in treatment of his cancer and still not have a normal quality of life, he almost wishes the cancer would have taken his life. He is strongly considering discontinuing leuprolide and bicalutamide.  He states he has had fleeting suicidal thoughts, but denies intent or plan to harm himself.  He states his brother committed suicide. He also is responsible for all his household maintenance, as his partner is unable to assist him.  He has been doing quite a bit around the house and in the yard.  He had his 3 month leuprolide injection recently with Dr. Nila Nephew. He spoke with him about the incontinence and he potentially could have surgery to place an artificial sphincter.  He continues bicalutamide daily.  He states his fatigue and strength are slowly improving.  He denies fevers or chills. He denies pain. His appetite is good, but he is no longer using nutritional supplements.Marland Kitchen His weight has decreased 5 pounds over last 4 weeks. He  states he was off his bicalutamide for at least a 1 week prior to having his PSA drawn at Dr. Ara Kussmaul office.  REVIEW OF SYSTEMS:  Review of Systems  Constitutional: Positive for unexpected weight change. Negative for appetite change, chills, fatigue and fever.  HENT:   Negative for lump/mass, mouth sores and sore throat.   Respiratory: Positive for cough (Since bronchitis last month) and shortness of breath (Occasional dyspnea with exertion).   Cardiovascular: Negative for chest pain and leg swelling.  Gastrointestinal: Negative for abdominal pain, constipation, diarrhea, nausea and vomiting.  Endocrine: Positive for hot flashes.  Genitourinary: Positive for bladder incontinence. Negative for difficulty urinating, dysuria, frequency and hematuria.   Musculoskeletal: Negative for arthralgias, back pain and myalgias.  Skin: Negative for itching, rash and wound.  Neurological: Negative for dizziness, extremity weakness, headaches, light-headedness and numbness.  Hematological: Negative for adenopathy.  Psychiatric/Behavioral: Positive for depression. Negative for sleep disturbance. The patient is nervous/anxious.      VITALS:  Blood pressure 139/74, pulse 67, temperature 98.4 F (36.9 C), temperature source Oral, resp. rate 16, height _0  (1.702 m), weight 139 lb (63 kg), SpO2 97 %.  Wt Readings from Last 3 Encounters:  04/17/21 139 lb (63 kg)  03/20/21 144 lb (  65.3 kg)  02/20/21 149 lb 3.2 oz (67.7 kg)    Body mass index is 21.77 kg/m.  Performance status (ECOG): 0 - Asymptomatic  PHYSICAL EXAM:  Physical Exam Vitals and nursing note reviewed.  Constitutional:      General: He is not in acute distress.    Appearance: Normal appearance. He is normal weight.  HENT:     Head: Normocephalic and atraumatic.     Mouth/Throat:     Mouth: Mucous membranes are moist.     Pharynx: Oropharynx is clear. No oropharyngeal exudate or posterior oropharyngeal erythema.  Eyes:     General:  No scleral icterus.    Extraocular Movements: Extraocular movements intact.     Conjunctiva/sclera: Conjunctivae normal.     Pupils: Pupils are equal, round, and reactive to light.  Cardiovascular:     Rate and Rhythm: Normal rate and regular rhythm.     Heart sounds: Normal heart sounds. No murmur heard. No friction rub. No gallop.   Pulmonary:     Effort: Pulmonary effort is normal.     Breath sounds: Normal breath sounds. No wheezing, rhonchi or rales.  Chest:  Breasts:     Right: No axillary adenopathy or supraclavicular adenopathy.     Left: No axillary adenopathy or supraclavicular adenopathy.    Abdominal:     General: Bowel sounds are normal. There is no distension.     Palpations: Abdomen is soft. There is no hepatomegaly, splenomegaly or mass.     Tenderness: There is no abdominal tenderness.  Musculoskeletal:        General: Normal range of motion.     Cervical back: Normal range of motion and neck supple. No tenderness.     Right lower leg: No edema.     Left lower leg: No edema.  Lymphadenopathy:     Cervical: No cervical adenopathy.     Upper Body:     Right upper body: No supraclavicular or axillary adenopathy.     Left upper body: No supraclavicular or axillary adenopathy.     Lower Body: No right inguinal adenopathy. No left inguinal adenopathy.  Skin:    General: Skin is warm and dry.     Coloration: Skin is not jaundiced.     Findings: No rash.  Neurological:     Mental Status: He is alert and oriented to person, place, and time.     Cranial Nerves: No cranial nerve deficit.  Psychiatric:        Mood and Affect: Mood is anxious and depressed. Affect is angry.        Behavior: Behavior normal.        Thought Content: Thought content normal.    LABS:   CBC Latest Ref Rng & Units 04/17/2021 03/20/2021 02/20/2021  WBC - 4.8 4.0 4.2  Hemoglobin 13.5 - 17.5 13.9 13.3(A) 12.3(A)  Hematocrit 41 - 53 39(A) 39(A) 37(A)  Platelets 150 - 399 209 196 239   CMP  Latest Ref Rng & Units 04/17/2021 03/20/2021 02/20/2021  Glucose 70 - 99 mg/dL - - -  BUN 4 - _0 Creatinine 0.6 - 1.3 0.8 0.8 1.0  Sodium 137 - 147 139 136(A) 140  Potassium 3.4 - 5.3 3.8 4.0 4.2  Chloride 99 - 108 107 109(A) 106  CO2 13 - 22 23(A) 22 27(A)  Calcium 8.7 - 10.7 8.7 8.6(A) 9.7  Total Protein 6.5 - 8.1 g/dL - - -  Total Bilirubin 0.3 - 1.2  mg/dL - - -  Alkaline Phos 25 - 125 64 57 49  AST 14 - 40 31 26 35  ALT 10 - 40 _0 PSA is pending from today.  No results found for: CEA1 / No results found for: CEA1 Lab Results  Component Value Date   PSA1 1.6 10/22/2020   No results found for: GHW299 No results found for: CAN125  No results found for: TOTALPROTELP, ALBUMINELP, A1GS, A2GS, BETS, BETA2SER, GAMS, MSPIKE, SPEI No results found for: TIBC, FERRITIN, IRONPCTSAT No results found for: LDH  STUDIES:  No results found.    HISTORY:   Past Medical History:  Diagnosis Date  . Allergy   . Depression   . Dupuytren's contracture   . GERD (gastroesophageal reflux disease)   . Malignant neoplasm of prostate ([place])   . Osteoarthritis   . Secondary malignant neoplasm of liver ([place])   . Secondary malignant neoplasm of liver [facility])     Past Surgical History:  Procedure Laterality Date  . APPENDECTOMY  1970  . HERNIA REPAIR  03/2020  . PROSTATECTOMY  2019    Family History  Problem Relation Age of Onset  . Colon cancer Brother 47  . Cancer Maternal Uncle   . Colon cancer Maternal Grandfather 41    Social History:  reports that he has quit smoking. He has never used smokeless tobacco. He reports that he does not drink alcohol. No history on file for drug use.The patient is alone today.  Allergies:  Allergies  Allergen Reactions  . Other Other (See Comments)    [place]     Current Medications: Current Outpatient Medications  Medication Sig Dispense Refill  . bicalutamide (CASODEX) 50 MG tablet Take 40 mg by mouth daily.    .  ondansetron (ZOFRAN) 4 MG tablet Take 4 mg by mouth every 8 (eight) hours as needed for nausea or vomiting.    Marland Kitchen oxyCODONE (OXY IR/ROXICODONE) 5 MG immediate release tablet Take 5 mg by mouth every 4 (four) hours as needed for severe pain.    . pantoprazole (PROTONIX) 40 MG tablet pantoprazole 40 mg tablet,delayed release  TAKE 1 TABLET BY MOUTH TWICE DAILY WITH BREAKFAST AND EVENING MEAL    . prochlorperazine (COMPAZINE) 10 MG tablet Take 10 mg by mouth every 6 (six) hours as needed for nausea or vomiting.    . temazepam (RESTORIL) 15 MG capsule Take 15 mg by mouth at bedtime as needed for sleep.    . traZODone (DESYREL) 50 MG tablet      No current facility-administered medications for this visit.    ASSESSMENT & PLAN:   Assessment:  1.  Stage III prostate cancer diagnosed in April 2019 treated with radical prostatectomy and adjuvant radiation therapy.  He has continued leuprolide injections every 3 months, as well as bicalutamide since March 2021.  2.  Liver metastases, biopsy proven to be metastatic from his prostate cancer. He received 12 cycles of docetaxel/prednisone. His PSA has continued to decrease. CT imaging in March revealed stable liver lesions.   He continues bicalutamide and leuprolide, but is strongly considering discontinuing bicalutamide.  I contacted Dr. Ara Kussmaul office and the patient's PSA has increased to 1.6.  The patient feels this may be due to him being off of bicalutamide, so he would like to continue that for now.  3.  Macrocytic anemia, which continues to improve.  4.  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  5.  Anger and depression over his  cancer and adverse effects of treatment.  Certainly mood changes can be attributed to bicalutamide and leuprolide. After talking with me today, he states he is feeling better and has a better perspective. His mood has definitely improved.  He does not wish to try an antidepressant or counseling.  He declined the mobile  crisis line number, stating that he is fine now.   Plan:    We can consider discontinuing bicalutamide and trying him on alternative therapy. The patient would like to continue bicalutamide for now. We will plan to see him back in 4 weeks with a CBC, comprehensive metabolic panel and PSA for continued supportive care.  If his PSA continues to rise, we will likely repeat CT imaging prior to switching him to alternative therapy, likely with apalutamide. The patient understands the plans discussed today and is in agreement with them.  He knows to contact our office if he develops concerns prior to his next appointment.  I provided 40 minutes (12:59 PM - 12:59 PM) of face-to-face time during this this encounter and > 50% was spent counseling as documented under my assessment and plan.      Marvia Pickles, PA-C

## 2021-05-13 ENCOUNTER — Telehealth: Payer: Self-pay | Admitting: Hematology and Oncology

## 2021-05-13 NOTE — Telephone Encounter (Signed)
Patient rescheduled 6/8 Labs, Follow Up to 6/23

## 2021-05-15 ENCOUNTER — Ambulatory Visit: Payer: Medicaid Other | Admitting: Hematology and Oncology

## 2021-05-15 ENCOUNTER — Inpatient Hospital Stay: Payer: Medicaid Other | Admitting: Hematology and Oncology

## 2021-05-15 ENCOUNTER — Inpatient Hospital Stay: Payer: Medicaid Other

## 2021-05-29 NOTE — Progress Notes (Signed)
[facility]  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  He had symptoms of urinary frequency and a steadily rising PSA.  Biopsy confirmed prostatic adenocarcinoma with a Gleason 8, 4+ 4, in 100% of the biopsy submitted.  He also had cystoscopy at that time, which was benign.  He has had hematuria and hematospermia in the past.  CT imaging in May revealed mild prostatic enlargement and a bone scan was negative for metastatic disease.  He underwent radical prostatectomy and bilateral pelvic lymphadenectomy in May 2019.  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  There was extraprostatic extension at bilateral apices and positive margins at the left posterior lateral wall.  There was also infiltration of the seminal vesicles.  The tumor volume was approximately 50% of the gland.  Bilateral lymph nodes were clear.  He had a weight loss of 80 lb in the perioperative period.  Due to his aggressive prostate cancer and family history of malignancy, he underwent testing for hereditary cancer syndromes with the [employer] myRisk Hereditary Cancer Panel.  This did not reveal any clinically significant mutation or variants of uncertain significance.    He was recommended for adjuvant radiation therapy.  He received 2720 cGy for 15 fractions and then stopped in August 2019.  In January 2020, he came in for 1 treatment and in May 2020 he went back on daily treatment and had the remaining 5220 cGy in 29 fractions for a total cumulative dose of 7940 cGy. PET scan in July 2020 revealed  mild hypermetabolic activity with soft tissue thickening in the deep pelvis felt to be postsurgical in nature.  There was no evidence of nodal metastasis or distant metastasis.  CT abdomen and pelvis done in the emergency room August 2020 revealed two hypodense liver lesions, new compared to imaging from 2019, ranging from 16-25 mm, which were suspicious for liver metastases, but were PET negative the month prior.     He continued to follow with Dr. Nila Nephew and remained on Lupron injections every 4 months. The PSA was up 26.1 in January 2021.  Bicalutamide was added in March.  The PSA had decreased to 9.7 in April.  We began seeing him again in May 2021, at which time the PSA was stable at 9.6.  We recommended CT imaging and bone scan.  CT abdomen and pelvis revealed interval progression of liver metastases with the largest measuring 3 cm. Bone scan was negative for metastatic disease, but revealed degenerative changes throughout the cervical spine, as well as within the bilateral shoulders and bilateral knees. Ultrasound guided biopsy of the liver in June revealed metastatic carcinoma consistent with the patient's known primary prostatic cancer. He continued his hormonal therapy.  We therefore recommended chemotherapy with docetaxel/prednisone, which he started in June.    CT chest, abdomen and pelvis in September revealed stable right lower lobe pulmonary nodules. There was an interval decrease an size of the hepatic metastatic lesions.  The largest of these lesions previously measuring 30 mm, now measure 16 mm.  No new lesions or abdominal/pelvic lymphadenopathy was observed.  There were no findings of osseous metastatic disease. The PSA steadily decreased.  CT abdomen and pelvis in December revealed mild improvement in the hepatic metastasis with the largest lesion now measuring 1.7 cm, previously 1.9 cm.  No abdominopelvic adenopathy or evidence of extrahepatic metastatic disease was observed.    He  completed 12 cycles of palliative docetaxel chemotherapy in February.  He reported his last PSA with Dr. Nila Nephew was 0.38. CT chest, abdomen and pelvis in March did reveal any evidence of locally recurrent mass or lymphadenopathy. The multiple low-attenuation metastatic lesions of the liver were not significantly changed compared to prior examination. There was no evidence of new metastatic disease in the chest, abdomen, or pelvis or CT evidence of osseous metastatic disease.  At his visit in April, had recently been placed on antibiotics, inhaler and prednisone pack for bronchitis, but he did not complete the prednisone as he developed severe toxicities of flushing, dizziness and irritability. He was otherwise doing well and the PSA was fairly stable at 0.64, previously 0.51.  At his visit in May, he was feeling angry and depressed. His quality of life is limited by his incontinence. He was not sure he wished to consider surgery to place an artificial sphincter.   He was strongly considering discontinuing leuprolide and bicalutamide, mainly due to lack of energy.  He also is responsible for all his household maintenance, as his partner is unable to assist him.  He declined antidepressants her counseling, but did feel better after talking to me.   Recent PSA at Dr. Nila Nephew is office was elevated at 1.6. The patient states he was off his bicalutamide for at least a 1 week prior to having his PSA drawn at Dr. Ara Kussmaul office, so attributes the increased to missed doses.  I offered to switch him to alternative therapy, possibly with apalutamide, but he declined.  INTERVAL HISTORY:  Jeffrey Lambert is here today for repeat clinical assessment and states he is still only taking bicalutamide randomly.  He states his mental well being has improved.  It does not feel constantly angry or depressed.  He states his energy level is better. He continues to report chronic cough productive of thick, white sputum, which he attributes to  allergies.  He states he is going to try Allegra D for this. He denies dyspnea or chest pain. He denies fevers or chills.  He has had some swelling of the left elbow, which is uncomfortable in certain positions. He otherwise denies pain. His appetite is good. His weight has been stable.   REVIEW OF SYSTEMS:  Review of Systems  Constitutional:  Negative for appetite change, chills, fatigue, fever and unexpected weight change.  HENT:   Negative for lump/mass, mouth sores and sore throat.   Respiratory:  Positive for cough. Negative for shortness of breath.   Cardiovascular:  Negative for chest pain and leg swelling.  Gastrointestinal:  Negative for abdominal pain, constipation, diarrhea, nausea and vomiting.  Genitourinary:  Negative for difficulty urinating, dysuria, frequency and hematuria.   Musculoskeletal:  Negative for arthralgias, back pain and myalgias.  Skin:  Negative for itching, rash and wound.  Neurological:  Negative for dizziness, extremity weakness, headaches, light-headedness and numbness.  Hematological:  Negative for adenopathy.  Psychiatric/Behavioral:  Negative for depression and sleep disturbance. The patient is not nervous/anxious.     VITALS:  Blood pressure 119/72, pulse 81, temperature 99.1 F (37.3 C), temperature source Oral, resp. rate 18, height 5' 7"  (1.702 m), weight 138 lb 11.2  oz (62.9 kg), SpO2 97 %.  Wt Readings from Last 3 Encounters:  05/30/21 138 lb 11.2 oz (62.9 kg)  04/17/21 139 lb (63 kg)  03/20/21 144 lb (65.3 kg)    Body mass index is 21.72 kg/m.  Performance status (ECOG): 0 - Asymptomatic  PHYSICAL EXAM:  Physical Exam Vitals and nursing note reviewed.  Constitutional:      General: He is not in acute distress.    Appearance: Normal appearance. He is normal weight.  HENT:     Head: Normocephalic and atraumatic.     Mouth/Throat:     Mouth: Mucous membranes are moist.     Pharynx: Oropharynx is clear. No oropharyngeal exudate or  posterior oropharyngeal erythema.  Eyes:     General: No scleral icterus.    Extraocular Movements: Extraocular movements intact.     Conjunctiva/sclera: Conjunctivae normal.     Pupils: Pupils are equal, round, and reactive to light.  Cardiovascular:     Rate and Rhythm: Normal rate and regular rhythm.     Heart sounds: Normal heart sounds. No murmur heard.   No friction rub. No gallop.  Pulmonary:     Effort: Pulmonary effort is normal.     Breath sounds: Normal breath sounds. No wheezing, rhonchi or rales.  Chest:  Breasts:    Right: No axillary adenopathy or supraclavicular adenopathy.     Left: No axillary adenopathy or supraclavicular adenopathy.  Abdominal:     General: Bowel sounds are normal. There is no distension.     Palpations: Abdomen is soft. There is no hepatomegaly, splenomegaly or mass.     Tenderness: There is no abdominal tenderness.  Musculoskeletal:        General: Normal range of motion.     Cervical back: Normal range of motion and neck supple. No tenderness.     Right lower leg: No edema.     Left lower leg: No edema.  Lymphadenopathy:     Cervical: No cervical adenopathy.     Upper Body:     Right upper body: No supraclavicular or axillary adenopathy.     Left upper body: No supraclavicular or axillary adenopathy.     Lower Body: No right inguinal adenopathy. No left inguinal adenopathy.  Skin:    General: Skin is warm and dry.     Coloration: Skin is not jaundiced.     Findings: No rash.  Neurological:     Mental Status: He is alert and oriented to person, place, and time.     Cranial Nerves: No cranial nerve deficit.  Psychiatric:        Mood and Affect: Mood is anxious and depressed. Affect is angry.        Behavior: Behavior normal.        Thought Content: Thought content normal.   LABS:   CBC Latest Ref Rng & Units 05/30/2021 04/17/2021 03/20/2021  WBC - 6.4 4.8 4.0  Hemoglobin 13.5 - 17.5 13.2(A) 13.9 13.3(A)  Hematocrit 41 - 53 37(A)  39(A) 39(A)  Platelets 150 - 399 211 209 196   CMP Latest Ref Rng & Units 05/30/2021 04/17/2021 03/20/2021  Glucose 70 - 99 mg/dL - - -  BUN 4 - 21 13 13 18   Creatinine 0.6 - 1.3 0.9 0.8 0.8  Sodium 137 - 147 138 139 136(A)  Potassium 3.4 - 5.3 3.8 3.8 4.0  Chloride 99 - 108 103 107 109(A)  CO2 13 - 22 27(A) 23(A) 22  Calcium 8.7 -  10.7 8.8 8.7 8.6(A)  Total Protein 6.5 - 8.1 g/dL - - -  Total Bilirubin 0.3 - 1.2 mg/dL - - -  Alkaline Phos 25 - 125 64 64 57  AST 14 - 40 28 31 26   ALT 10 - 40 29 26 23    PSA is pending from today.  No results found for: CEA1 / No results found for: CEA1 Lab Results  Component Value Date   PSA1 1.6 10/22/2020   No results found for: VEL381 No results found for: CAN125  No results found for: TOTALPROTELP, ALBUMINELP, A1GS, A2GS, BETS, BETA2SER, GAMS, MSPIKE, SPEI No results found for: TIBC, FERRITIN, IRONPCTSAT No results found for: LDH  STUDIES:  No results found.    HISTORY:   Past Medical History:  Diagnosis Date   Allergy    Depression    Dupuytren's contracture    GERD (gastroesophageal reflux disease)    Malignant neoplasm of prostate (HCC)    Osteoarthritis    Secondary malignant neoplasm of liver ([place])    Secondary malignant neoplasm of liver [facility])     Past Surgical History:  Procedure Laterality Date   APPENDECTOMY  1970   HERNIA REPAIR  03/2020   PROSTATECTOMY  2019    Family History  Problem Relation Age of Onset   Colon cancer Brother 57   Cancer Maternal Uncle    Colon cancer Maternal Grandfather 19    Social History:  reports that he has quit smoking. He has never used smokeless tobacco. He reports that he does not drink alcohol. No history on file for drug use.The patient is alone today.  Allergies:  Allergies  Allergen Reactions   Other Other (See Comments)    Grass Pollen     Current Medications: Current Outpatient Medications  Medication Sig Dispense Refill   amoxicillin-clavulanate (AUGMENTIN)  875-125 MG tablet Take 1 tablet by mouth 2 (two) times daily.     bicalutamide (CASODEX) 50 MG tablet Take 40 mg by mouth daily.     ondansetron (ZOFRAN) 4 MG tablet Take 4 mg by mouth every 8 (eight) hours as needed for nausea or vomiting.     oxyCODONE (OXY IR/ROXICODONE) 5 MG immediate release tablet Take 5 mg by mouth every 4 (four) hours as needed for severe pain.     pantoprazole (PROTONIX) 40 MG tablet pantoprazole 40 mg tablet,delayed release  TAKE 1 TABLET BY MOUTH TWICE DAILY WITH BREAKFAST AND EVENING MEAL     prochlorperazine (COMPAZINE) 10 MG tablet Take 10 mg by mouth every 6 (six) hours as needed for nausea or vomiting.     temazepam (RESTORIL) 15 MG capsule Take 15 mg by mouth at bedtime as needed for sleep.     traZODone (DESYREL) 50 MG tablet      No current facility-administered medications for this visit.    ASSESSMENT & PLAN:   Assessment:  1.  Stage III prostate cancer diagnosed in April 2019 treated with radical prostatectomy and adjuvant radiation therapy.  He has continued leuprolide injections every 3 months, as well as bicalutamide since March 2021.   2.  Liver metastases, biopsy proven to be metastatic from his prostate cancer. He received 12 cycles of docetaxel/prednisone. His PSA has continued to decrease. CT imaging in March revealed stable liver lesions.   The PSA increased to 1.6 at Dr. Ara Kussmaul office in May. This is likely due to him being off bicalutamide.  Since his last visit, he has not been compliant with bicalutamide.  PSA is  pending from today.   3.  Macrocytic anemia, which is stable.   4.  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  5.  Anger and depression over his cancer and adverse effects of treatment.  Certainly mood changes can be attributed to bicalutamide and leuprolide.   Plan:     PSA is pending from today, but was rising last month.  This is likely due to noncompliance.  If his PSA is not significantly changed, he would like to  continue bicalutamide.  He understands the importance of daily dosing without missed doses. We will plan to see him back in 4 weeks with a CBC, comprehensive metabolic panel and PSA for continued supportive care. If there is a continued increase in the PSA, I would repeat imaging. The patient understands the plans discussed today and is in agreement with them.  He knows to contact our office if he develops concerns prior to his next appointment.       Marvia Pickles, PA-C

## 2021-05-30 ENCOUNTER — Inpatient Hospital Stay (INDEPENDENT_AMBULATORY_CARE_PROVIDER_SITE_OTHER): Payer: Medicaid Other | Admitting: Hematology and Oncology

## 2021-05-30 ENCOUNTER — Telehealth: Payer: Self-pay | Admitting: Hematology and Oncology

## 2021-05-30 ENCOUNTER — Other Ambulatory Visit: Payer: Self-pay

## 2021-05-30 ENCOUNTER — Inpatient Hospital Stay: Payer: Medicaid Other | Attending: Oncology

## 2021-05-30 ENCOUNTER — Encounter: Payer: Self-pay | Admitting: Hematology and Oncology

## 2021-05-30 VITALS — BP 119/72 | HR 81 | Temp 99.1°F | Resp 18 | Ht 67.0 in | Wt 138.7 lb

## 2021-05-30 DIAGNOSIS — C61 Malignant neoplasm of prostate: Secondary | ICD-10-CM | POA: Insufficient documentation

## 2021-05-30 LAB — HEPATIC FUNCTION PANEL
ALT: 29 (ref 10–40)
AST: 28 (ref 14–40)
Alkaline Phosphatase: 64 (ref 25–125)
Bilirubin, Total: 1.2

## 2021-05-30 LAB — CBC AND DIFFERENTIAL
HCT: 37 — AB (ref 41–53)
Hemoglobin: 13.2 — AB (ref 13.5–17.5)
Neutrophils Absolute: 4.22
Platelets: 211 (ref 150–399)
WBC: 6.4

## 2021-05-30 LAB — BASIC METABOLIC PANEL
BUN: 13 (ref 4–21)
CO2: 27 — AB (ref 13–22)
Chloride: 103 (ref 99–108)
Creatinine: 0.9 (ref 0.6–1.3)
Glucose: 129
Potassium: 3.8 (ref 3.4–5.3)
Sodium: 138 (ref 137–147)

## 2021-05-30 LAB — COMPREHENSIVE METABOLIC PANEL
Albumin: 4.5 (ref 3.5–5.0)
Calcium: 8.8 (ref 8.7–10.7)

## 2021-05-30 LAB — CBC: RBC: 3.99 (ref 3.87–5.11)

## 2021-05-30 NOTE — Telephone Encounter (Signed)
Per 6/23 los next appt scheduled and given to patient 

## 2021-05-31 LAB — PROSTATE-SPECIFIC AG, SERUM (LABCORP): Prostate Specific Ag, Serum: 8.1 ng/mL — ABNORMAL HIGH (ref 0.0–4.0)

## 2021-06-03 ENCOUNTER — Other Ambulatory Visit: Payer: Self-pay | Admitting: Hematology and Oncology

## 2021-06-03 DIAGNOSIS — C61 Malignant neoplasm of prostate: Secondary | ICD-10-CM

## 2021-06-03 DIAGNOSIS — R972 Elevated prostate specific antigen [PSA]: Secondary | ICD-10-CM

## 2021-06-03 NOTE — Progress Notes (Signed)
PSA increased from 1.6 and made 8.1 in June.  I contacted the patient with the results. We will plan to repeat an Axumin PET scan in 3 weeks to reassess his disease baseline prior to his next appointment.  He is in agreement.

## 2021-06-13 ENCOUNTER — Other Ambulatory Visit: Payer: Self-pay | Admitting: Hematology and Oncology

## 2021-06-13 DIAGNOSIS — C61 Malignant neoplasm of prostate: Secondary | ICD-10-CM

## 2021-06-13 DIAGNOSIS — C787 Secondary malignant neoplasm of liver and intrahepatic bile duct: Secondary | ICD-10-CM

## 2021-06-18 ENCOUNTER — Other Ambulatory Visit: Payer: Self-pay | Admitting: Hematology and Oncology

## 2021-06-18 DIAGNOSIS — R972 Elevated prostate specific antigen [PSA]: Secondary | ICD-10-CM

## 2021-06-18 DIAGNOSIS — R9721 Rising PSA following treatment for malignant neoplasm of prostate: Secondary | ICD-10-CM

## 2021-06-18 DIAGNOSIS — C787 Secondary malignant neoplasm of liver and intrahepatic bile duct: Secondary | ICD-10-CM

## 2021-06-18 DIAGNOSIS — C61 Malignant neoplasm of prostate: Secondary | ICD-10-CM

## 2021-06-18 NOTE — Progress Notes (Signed)
Did peer to peer for PSMA PET for metastatic prostate to liver with rising PSA. Per Dr. Ellyn Hack, [phone], [id number], not indicated as other imaging not done within past 60 days. Will order CT C/A/P and bone scan per Dr. Bishop Dublin.

## 2021-06-27 ENCOUNTER — Ambulatory Visit: Payer: Medicaid Other | Admitting: Hematology and Oncology

## 2021-06-27 ENCOUNTER — Other Ambulatory Visit: Payer: Medicaid Other

## 2021-07-02 ENCOUNTER — Telehealth: Payer: Self-pay | Admitting: Oncology

## 2021-07-02 NOTE — Telephone Encounter (Signed)
07/02/21 Spoke with patient and cancelled appts.Waiting on authorization from [employer].

## 2021-07-03 ENCOUNTER — Inpatient Hospital Stay: Payer: Medicare Other

## 2021-07-03 ENCOUNTER — Inpatient Hospital Stay: Payer: Medicare Other | Admitting: Hematology and Oncology

## 2021-07-12 IMAGING — CT CT ABD-PEL WO/W CM
3 of 16 series · 13 of 46 positions shown, 18 images · IV contrast (omnipaque)
Comparison: 08/14/2020.

CLINICAL DATA: Prostate cancer. Evaluate treatment response. Liver
metastasis.

EXAM:
CT ABDOMEN AND PELVIS WITHOUT AND WITH CONTRAST
TECHNIQUE: Multidetector CT imaging of the abdomen and pelvis was performed
following the standard protocol before and following the bolus
administration of intravenous contrast.
CONTRAST:  100mL OMNIPAQUE IOHEXOL 300 MG/ML  SOLN

[Series 6: venous phase 3.0 i30f 2 · axial · portal-venous · 0.80mm/px · z∈[+730,+1189]mm · 3 of 154 slices shown, 7 images]
[im 1/154  soft-tissue]
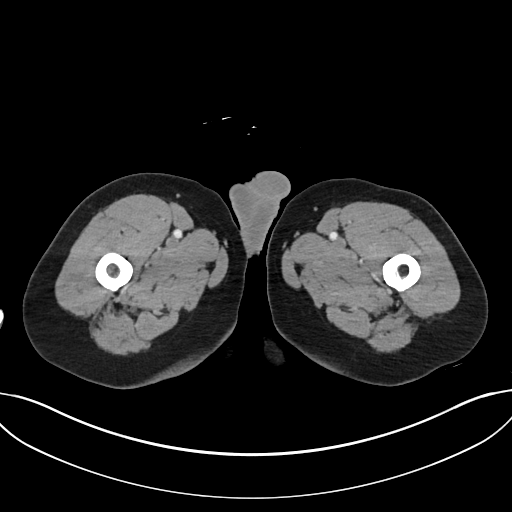
[im 1/154  lung]
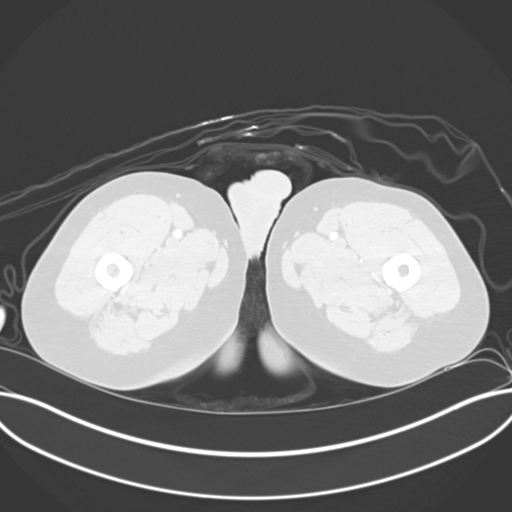
[im 1/154  bone]
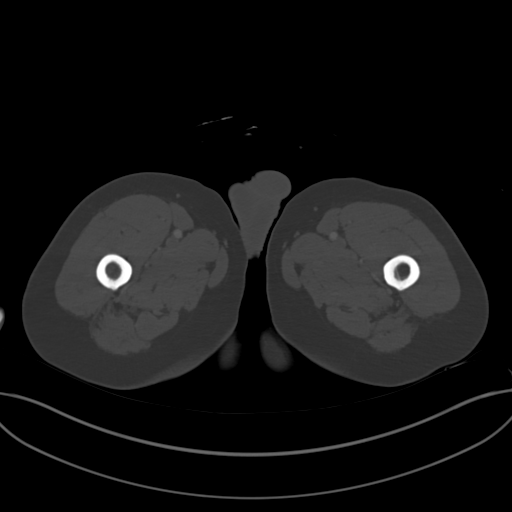
[im 77/154  soft-tissue]
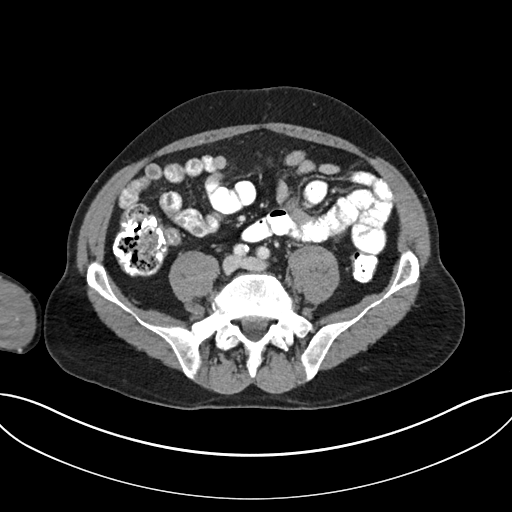
[im 77/154  lung]
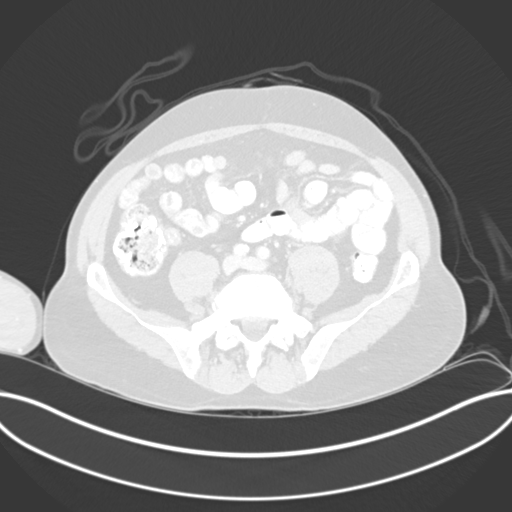
[im 154/154  soft-tissue]
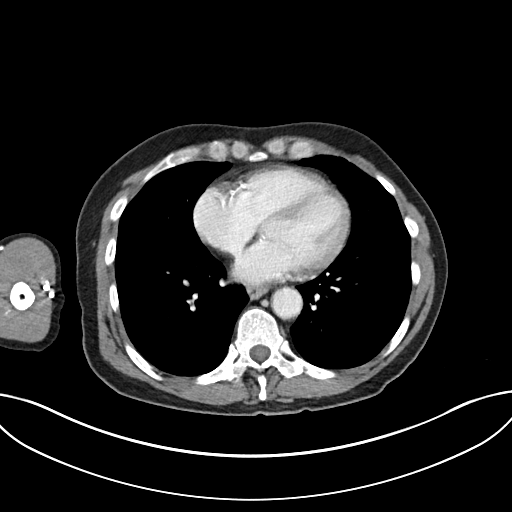
[im 154/154  lung]
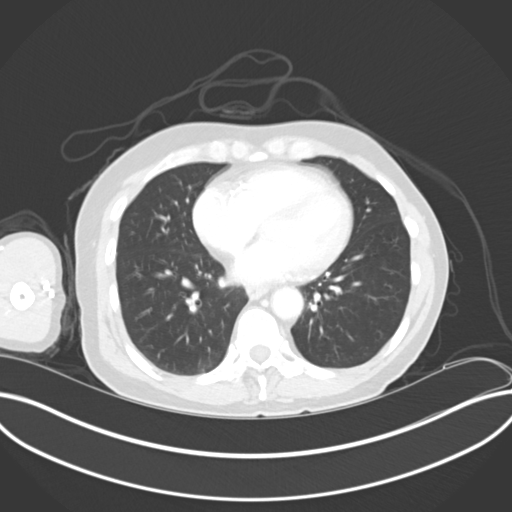

[Series 9: arterial phase 1.0 b30f thins · axial · arterial · 0.79mm/px · z∈[+774,+1143]mm · 8 of 577 slices shown]
[im 58/577  soft-tissue]
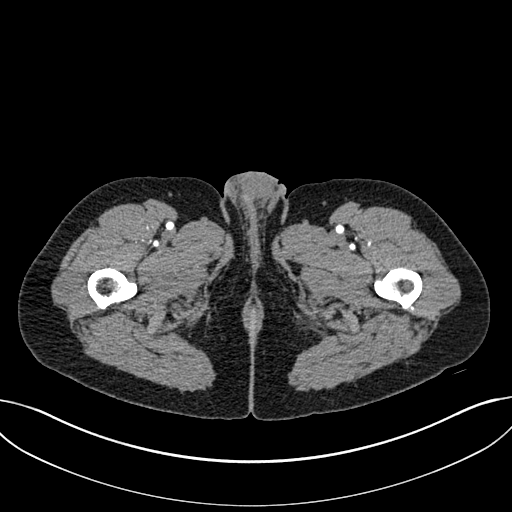
[im 116/577  soft-tissue]
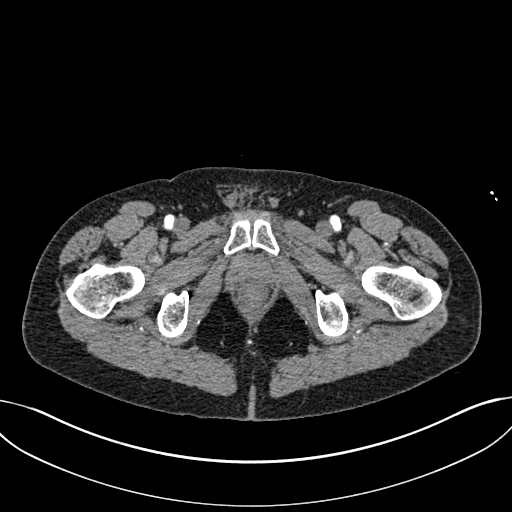
[im 173/577  soft-tissue]
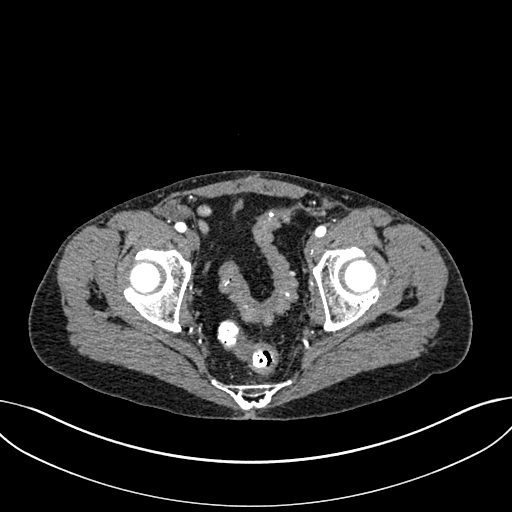
[im 231/577  soft-tissue]
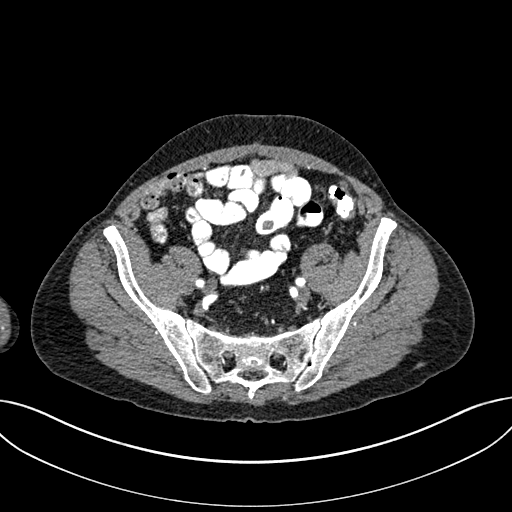
[im 346/577  soft-tissue]
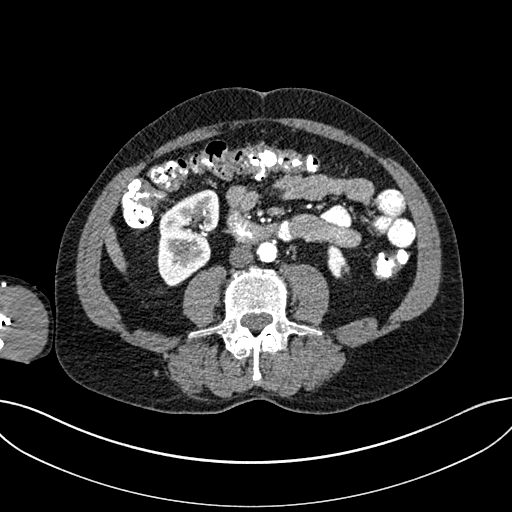
[im 404/577  soft-tissue]
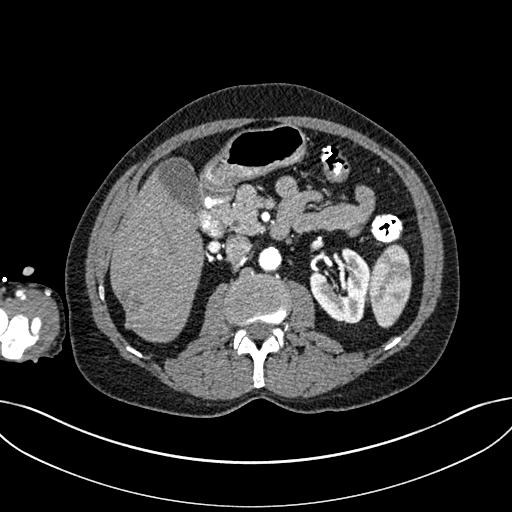
[im 461/577  soft-tissue]
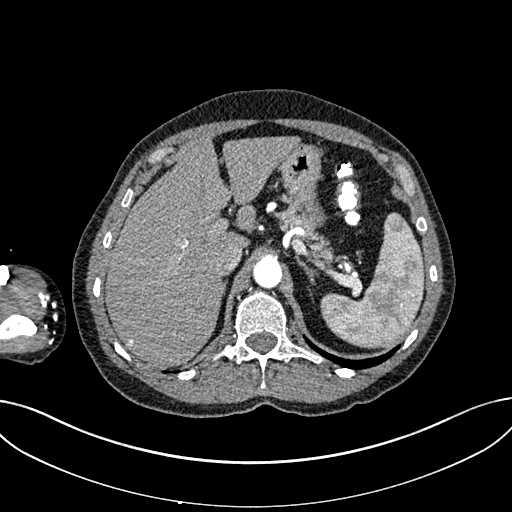
[im 519/577  soft-tissue]
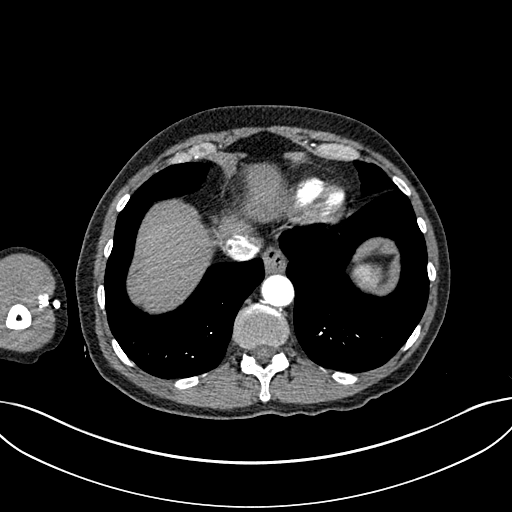

[Series 14: venous coronal · coronal · portal-venous · 0.81mm/px · 2 of 151 slices shown, 3 images]
[im 51/151  soft-tissue]
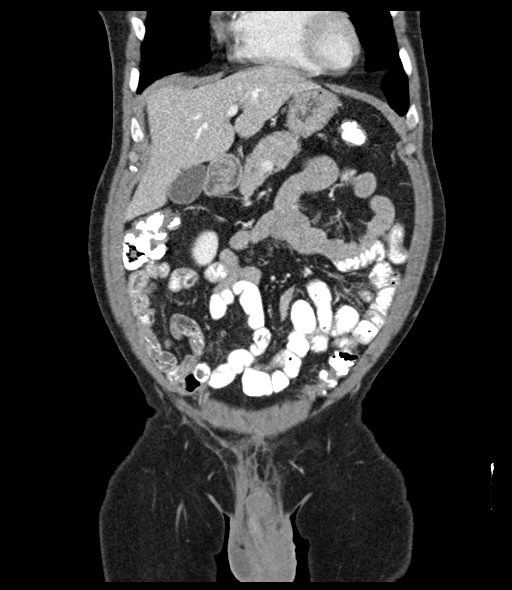
[im 51/151  bone]
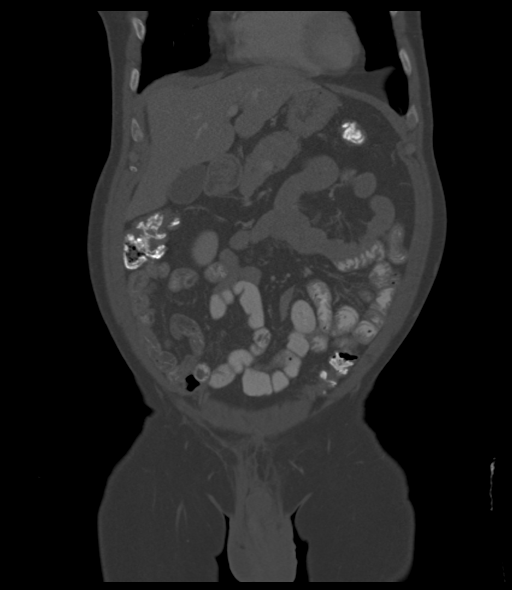
[im 101/151  soft-tissue]
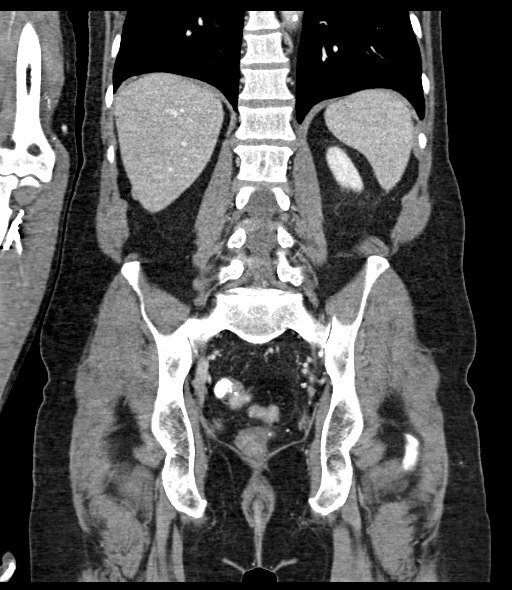

[13 of 46 positions shown; findings below may reference images not displayed]

FINDINGS: Lower chest: Clear lung bases. Normal heart size without pericardial
or pleural effusion. Right coronary artery calcification. Tiny
hiatal hernia.

Hepatobiliary: 2 adjacent right hepatic lobe lesions are again
identified. The more anterior measures 1.2 cm on 22/16 versus 1.6 cm
on the prior. The more posterior measures 6 mm on the same image
versus 8 mm on the prior.

More inferior subcapsular right hepatic lobe lesion measures 1.7 cm
on 30/16 versus 1.9 cm on the prior.

A too small to characterize pericholecystic right liver lobe lesion
on [DATE] represent a cyst.

Normal gallbladder, without biliary ductal dilatation.

Pancreas: Normal, without mass or ductal dilatation.

Spleen: Normal in size, without focal abnormality.

Adrenals/Urinary Tract: Normal adrenal glands. Lower pole left renal
subcentimeter cyst. Normal right kidney. No hydronephrosis. Normal
urinary bladder.

Stomach/Bowel: Normal remainder of the stomach. Normal colon and
terminal ileum. Normal small bowel.

Vascular/Lymphatic: Aortic atherosclerosis. No abdominal adenopathy.
Pelvic node dissection. No pelvic sidewall adenopathy.

Reproductive: Prostatectomy, without locally recurrent disease. Soft
tissue thickening within the deep pelvis including on 70/16 has been
present on multiple prior exams and could represent postoperative
scarring or residual seminal vesicles.

Other: Suspect prior right inguinal hernia repair.

Musculoskeletal: Lumbosacral spondylosis. Prominent disc bulge at
L3-4.
IMPRESSION: 1. Improvement in hepatic metastasis.
2. No abdominopelvic adenopathy or evidence of extrahepatic
metastatic disease.
3.  Tiny hiatal hernia.
4. Aortic atherosclerosis (IBV58-60S.S) and emphysema (IBV58-MQ1.0).

## 2021-07-29 ENCOUNTER — Inpatient Hospital Stay: Payer: Medicare Other | Admitting: Hematology and Oncology

## 2021-07-31 ENCOUNTER — Other Ambulatory Visit: Payer: Self-pay | Admitting: Oncology

## 2021-07-31 ENCOUNTER — Encounter: Payer: Self-pay | Admitting: Oncology

## 2021-07-31 ENCOUNTER — Telehealth: Payer: Self-pay

## 2021-07-31 ENCOUNTER — Other Ambulatory Visit: Payer: Self-pay

## 2021-07-31 ENCOUNTER — Inpatient Hospital Stay: Payer: Medicare Other | Attending: Oncology | Admitting: Oncology

## 2021-07-31 ENCOUNTER — Encounter: Payer: Self-pay | Admitting: Hematology and Oncology

## 2021-07-31 ENCOUNTER — Inpatient Hospital Stay: Payer: Medicare Other

## 2021-07-31 VITALS — BP 130/72 | HR 72 | Temp 98.4°F | Resp 16 | Ht 67.0 in | Wt 135.8 lb

## 2021-07-31 DIAGNOSIS — C61 Malignant neoplasm of prostate: Secondary | ICD-10-CM

## 2021-07-31 DIAGNOSIS — C787 Secondary malignant neoplasm of liver and intrahepatic bile duct: Secondary | ICD-10-CM

## 2021-07-31 DIAGNOSIS — J42 Unspecified chronic bronchitis: Secondary | ICD-10-CM

## 2021-07-31 LAB — COMPREHENSIVE METABOLIC PANEL
Albumin: 4.4 (ref 3.5–5.0)
Calcium: 8.8 (ref 8.7–10.7)

## 2021-07-31 LAB — BASIC METABOLIC PANEL
BUN: 13 (ref 4–21)
CO2: 20 (ref 13–22)
Chloride: 107 (ref 99–108)
Creatinine: 0.9 (ref 0.6–1.3)
Glucose: 107
Potassium: 4.2 (ref 3.4–5.3)
Sodium: 139 (ref 137–147)

## 2021-07-31 LAB — CBC AND DIFFERENTIAL
HCT: 39 — AB (ref 41–53)
Hemoglobin: 13.4 — AB (ref 13.5–17.5)
Neutrophils Absolute: 3.36
Platelets: 210 (ref 150–399)
WBC: 4.8

## 2021-07-31 LAB — PSA: Prostatic Specific Antigen: 32.93 ng/mL — ABNORMAL HIGH (ref 0.00–4.00)

## 2021-07-31 LAB — HEPATIC FUNCTION PANEL
ALT: 55 — AB (ref 10–40)
AST: 49 — AB (ref 14–40)
Alkaline Phosphatase: 69 (ref 25–125)
Bilirubin, Total: 0.5

## 2021-07-31 LAB — CBC: RBC: 4.04 (ref 3.87–5.11)

## 2021-07-31 MED ORDER — PROAIR HFA 108 (90 BASE) MCG/ACT IN AERS: 2.0000 | INHALATION_SPRAY | Freq: Four times a day (QID) | RESPIRATORY_TRACT | 5 refills | Status: DC | PRN

## 2021-07-31 NOTE — Telephone Encounter (Signed)
-----  Message from Derwood Kaplan, MD sent at 07/31/2021 10:22 AM EDT ----- Regarding: referral Pls refer to [facility] for 2nd opiniion on prostate cancer met to liver

## 2021-07-31 NOTE — Progress Notes (Signed)
[place]  [address] [place],    [address] [phone]  Clinic Day:  07/31/2021  Referring physician: Reita May, NP  This document serves as a record of services personally performed by [name], MD. It was created on their behalf by [name],[name], a trained medical scribe. The creation of this record is based on the scribe's personal observations and the provider's statements to them.  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  He had symptoms of urinary frequency and a steadily rising PSA.  Biopsy confirmed prostatic adenocarcinoma with a Gleason 8, 4+ 4, in 100% of the biopsy submitted.  He also had cystoscopy at that time, which was benign.  He has had hematuria and hematospermia in the past.  CT imaging in May revealed mild prostatic enlargement and a bone scan was negative for metastatic disease.  He underwent radical prostatectomy and bilateral pelvic lymphadenectomy in May 2019.  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  There was extraprostatic extension at bilateral apices and positive margins at the left posterior lateral wall.  There was also infiltration of the seminal vesicles.  The tumor volume was approximately 50% of the gland.  Bilateral lymph nodes were clear.  He had a weight loss of 80 lb in the perioperative period.  Due to his aggressive prostate cancer and family history of malignancy, he underwent testing for hereditary cancer syndromes with the [employer] myRisk Hereditary Cancer Panel.  This did not reveal any clinically significant mutation or variants of uncertain significance.    He was recommended for adjuvant radiation therapy.   He received 2720 cGy for 15 fractions and then stopped in August 2019.  In January 2020, he came in for 1 treatment and in May 2020 he went back on daily treatment and had the remaining 5220 cGy in 29 fractions for a total cumulative dose of 7940 cGy. PET scan in July 2020 revealed mild hypermetabolic activity with soft tissue thickening in the deep pelvis felt to be postsurgical in nature.  There was no evidence of nodal metastasis or distant metastasis.  CT abdomen and pelvis done in the emergency room August 2020 revealed two hypodense liver lesions, new compared to imaging from 2019, ranging from 16-25 mm, which were suspicious for liver metastases, but were PET negative the month prior.     He continued to follow with Dr. Nila Nephew and remained on Lupron injections every 4 months. The PSA was up 26.1 in January 2021.  Bicalutamide was added in March.  The PSA had decreased to 9.7 in April.  We began seeing him again in May 2021, at which time the PSA was stable at 9.6.  We recommended CT imaging and bone scan.  CT abdomen and pelvis revealed interval progression of liver metastases with the largest measuring 3 cm. Bone scan was negative for metastatic disease, but revealed degenerative changes throughout the cervical spine, as well as within the bilateral shoulders and bilateral knees. Ultrasound guided biopsy of the liver in June revealed metastatic carcinoma consistent with the patient's known primary prostatic cancer. He continued his hormonal therapy.  We therefore recommended chemotherapy with docetaxel/prednisone, which he started in June.  CT chest, abdomen and pelvis in  September 2021 revealed stable right lower lobe pulmonary nodules. There was an interval decrease an size of the hepatic metastatic lesions.  The largest of these lesions previously measuring 30 mm, now measure 16 mm.  No new lesions or abdominal/pelvic lymphadenopathy was observed.  There were no findings of osseous metastatic disease. The  PSA steadily decreased.  CT abdomen and pelvis in December revealed mild improvement in the hepatic metastasis with the largest lesion now measuring 1.7 cm, previously 1.9 cm.  No abdominopelvic adenopathy or evidence of extrahepatic metastatic disease was observed.    He completed 12 cycles of palliative docetaxel chemotherapy in February 2022.  He reported his last PSA with Dr. Nila Nephew was 0.38. CT chest, abdomen and pelvis in March did reveal any evidence of locally recurrent mass or lymphadenopathy. The multiple low-attenuation metastatic lesions of the liver were not significantly changed compared to prior examination. There was no evidence of new metastatic disease in the chest, abdomen, or pelvis or CT evidence of osseous metastatic disease.  PSA was fairly stable at 0.64, previously 0.51.  At his visit in May, he was feeling angry and depressed, but declined antidepressants. His quality of life is limited by his incontinence. He was not sure he wished to consider surgery to place an artificial sphincter.   He complained of lack of energy and toxicities of his medications, and was strongly considering discontinuing leuprolide and bicalutamide.  He also is responsible for all his household maintenance, as his partner is unable to assist him.  PSA at Dr. Nila Nephew is office was elevated at 1.6. The patient states he was off his bicalutamide for at least a 1 week prior to having his PSA drawn at Dr. Ara Kussmaul office, so attributes the increased to missed doses.   INTERVAL HISTORY:  Jeffrey Lambert is here for repeat clinical assessment.  He has been noncompliant with bicalutamide due to the side effects and feeling poorly.  He continues Leupron injections through Dr. Nila Nephew, and is scheduled for his next dose on August 30th.  He is currently on prednisone 5 mg daily, which he restarted himself, with improvement in his energy and pulmonary function.  He has been having shortness of breath and productive cough with clear sputum.   CT chest, abdomen and pelvis from August 18th revealed interval increase in size of an ill-defined hypodense lesion in the central right hepatic lobe which now measures 2.7 x 1.7 cm, previously measuring 2.0 x 0.9 cm.  On the study from Apr 26, 2020 this lesion measured 3.0 x 1.8 cm.  Otherwise similar to slightly decreased size of the multiple hypodense hepatic lesions seen on prior examination with the indexed lesion in the peripheral inferior right lobe of the liver now measuring 1.5 x 1.0 cm on image 75/2 previously 1.9 x 1.4 cm.  Stable small right lower lobe pulmonary nodules. No new or progressive findings in the chest.  No CT evidence of osseous metastatic disease.  Postsurgical change of prostatectomy. No evidence of local recurrence.  Nuclear bone scan revealed a solitary focus of increased radiotracer activity in the left anterior fifth rib at the costochondral junction, corresponding to a sclerotic area on CT. Favor healed fracture over solitary metastatic disease.  Multifocal degenerative changes involving the cervical spine, lumbar spine, bilateral shoulders.  His  appetite is good, and he has lost 3 pounds since his last visit.  He denies fever, chills or other signs of infection.  He denies nausea, vomiting, bowel issues, or abdominal pain.  He  denies sore throat, cough, dyspnea, or chest pain.  REVIEW OF SYSTEMS:  Review of Systems  Constitutional:  Positive for fatigue (improved on prednisone). Negative for appetite change, chills, fever and unexpected weight change.  HENT:  Negative.    Eyes: Negative.   Respiratory:  Positive for cough (productive with clear sputum) and shortness of breath. Negative for chest tightness, hemoptysis and wheezing.   Cardiovascular: Negative.  Negative for chest pain, leg swelling and palpitations.  Gastrointestinal: Negative.  Negative for abdominal distention, abdominal pain, blood in stool, constipation, diarrhea, nausea and vomiting.  Endocrine:  Negative.   Genitourinary: Negative.  Negative for difficulty urinating, dysuria, frequency and hematuria.   Musculoskeletal: Negative.  Negative for arthralgias, back pain, flank pain, gait problem and myalgias.  Skin: Negative.   Neurological: Negative.  Negative for dizziness, extremity weakness, gait problem, headaches, light-headedness, numbness, seizures and speech difficulty.  Hematological: Negative.   Psychiatric/Behavioral: Negative.  Negative for depression and sleep disturbance. The patient is not nervous/anxious.   All other systems reviewed and are negative.   VITALS:  Blood pressure 130/72, pulse 72, temperature 98.4 F (36.9 C), temperature source Oral, resp. rate 16, height 5' 7"  (1.702 m), weight 135 lb 12.8 oz (61.6 kg), SpO2 99 %.  Wt Readings from Last 3 Encounters:  07/31/21 135 lb 12.8 oz (61.6 kg)  05/30/21 138 lb 11.2 oz (62.9 kg)  04/17/21 139 lb (63 kg)    Body mass index is 21.27 kg/m.  Performance status (ECOG): 1 - Symptomatic but completely ambulatory  PHYSICAL EXAM:  Physical Exam Constitutional:      General: He is not in acute distress.    Appearance: Normal appearance. He is normal weight.  HENT:     Head: Normocephalic and atraumatic.  Eyes:     General: No scleral icterus.    Extraocular Movements: Extraocular movements intact.     Conjunctiva/sclera: Conjunctivae normal.     Pupils: Pupils are equal, round, and reactive to light.  Cardiovascular:     Rate and Rhythm: Normal rate and regular rhythm.     Pulses: Normal pulses.     Heart sounds: Normal heart sounds. No murmur heard.   No friction rub. No gallop.  Pulmonary:     Effort: Pulmonary effort is normal. No respiratory distress.     Breath sounds: Normal breath sounds.  Abdominal:     General: Bowel sounds are normal. There is no distension.     Palpations: Abdomen is soft. There is no hepatomegaly, splenomegaly or mass.     Tenderness: There is no abdominal tenderness.   Musculoskeletal:        General: Normal range of motion.     Cervical back: Normal range of motion and neck supple.     Right lower leg: No edema.     Left lower leg: No edema.  Lymphadenopathy:     Cervical: No cervical adenopathy.  Skin:    General: Skin is warm and dry.  Neurological:     General: No focal deficit present.     Mental Status: He is alert and oriented to person, place, and time. Mental status is at baseline.  Psychiatric:        Mood and Affect: Mood normal.        Behavior: Behavior normal.        Thought Content: Thought content normal.        Judgment: Judgment normal.   LABS:   CBC Latest Ref Rng & Units 05/30/2021  04/17/2021 03/20/2021  WBC - 6.4 4.8 4.0  Hemoglobin 13.5 - 17.5 13.2(A) 13.9 13.3(A)  Hematocrit 41 - 53 37(A) 39(A) 39(A)  Platelets 150 - 399 211 209 196   CMP Latest Ref Rng & Units 05/30/2021 04/17/2021 03/20/2021  Glucose 70 - 99 mg/dL - - -  BUN 4 - 21 13 13 18   Creatinine 0.6 - 1.3 0.9 0.8 0.8  Sodium 137 - 147 138 139 136(A)  Potassium 3.4 - 5.3 3.8 3.8 4.0  Chloride 99 - 108 103 107 109(A)  CO2 13 - 22 27(A) 23(A) 22  Calcium 8.7 - 10.7 8.8 8.7 8.6(A)  Total Protein 6.5 - 8.1 g/dL - - -  Total Bilirubin 0.3 - 1.2 mg/dL - - -  Alkaline Phos 25 - 125 64 64 57  AST 14 - 40 28 31 26   ALT 10 - 40 29 26 23     Lab Results  Component Value Date   PSA1 8.1 (H) 05/30/2021    STUDIES:  No results found.   EXAM: CT CHEST, ABDOMEN, AND PELVIS WITH CONTRAST  TECHNIQUE: Multidetector CT imaging of the chest, abdomen and pelvis was performed following the standard protocol during bolus administration of intravenous contrast.  CONTRAST:  80 cc of Isovue 370  COMPARISON:  Multiple priors including most recent CT February 18, 2021  FINDINGS: CT CHEST FINDINGS  Cardiovascular: Accessed right chest wall Port-A-Cath with tip at the superior cavoatrial junction. Aortic atherosclerosis without aneurysmal dilation. No central pulmonary  embolus. Normal caliber central pulmonary arteries. Coronary artery calcifications. Normal size heart. No significant pericardial effusion/thickening.  Mediastinum/Nodes: No discrete thyroid nodule. No pathologically enlarged mediastinal, hilar or axillary lymph nodes. The trachea and esophagus are unremarkable.  Lungs/Pleura: Stable 5 mm nodule in the superior segment of the right lower lobe on image 77/4. Stable 4 mm juxta phrenic lymph node in the right lower lobe on image 108/4. No new suspicious pulmonary nodules or masses. No focal consolidation. No pleural effusion. No pneumothorax. Similar biapical pleuroparenchymal scarring.  Musculoskeletal: No aggressive lytic or blastic lesions of bone.  CT ABDOMEN PELVIS FINDINGS  Hepatobiliary: Interval increase in size of an ill-defined hypodense lesion in the central right hepatic lobe which now measures 2.7 x 1.7 cm on image 67/2, on most recent prior multiphase CT this lesion appeared smaller on all acquisitions measuring 2.0 x 0.9 cm on portal venous phase. On the study from Apr 26, 2020 this lesion measured 3.0 x 1.8 cm. Otherwise similar to slightly decreased size of the multiple hypodense hepatic lesions seen on prior examination with the indexed lesion in the peripheral inferior right lobe of the liver now measuring 1.5 x 1.0 cm on image 75/2 previously 1.9 x 1.4 cm.  Gallbladder is decompressed.  No biliary ductal dilation.  Pancreas: Unremarkable. No pancreatic ductal dilatation or surrounding inflammatory changes.  Spleen: Within normal limits.  Adrenals/Urinary Tract: Adrenal glands are unremarkable. Kidneys are without renal calculi, solid enhancing lesion, or hydronephrosis. Similar mild symmetric wall thickening of an incompletely distended urinary bladder.  Stomach/Bowel: Radiopaque enteric contrast traverses the sigmoid colon. Stomach is unremarkable. No pathologic dilation of small or large bowel. No  evidence of acute bowel inflammation.  Vascular/Lymphatic: Aortic atherosclerosis without aneurysmal dilation. No pathologically enlarged abdominal or pelvic lymph nodes.  Reproductive: Postsurgical change of prostatectomy  Other: No abdominopelvic ascites.  Musculoskeletal: Lower lumbar predominant degenerative change. No aggressive lytic or blastic lesion of bone.  IMPRESSION: 1. Interval increase in size of an ill-defined hypodense lesion  in the central right hepatic lobe which now measures 2.7 x 1.7 cm, on most recent prior multiphase CT this lesion appeared smaller on all acquisitions measuring 2.0 x 0.9 cm on portal venous phase series and on the study from Apr 26, 2020 this lesion measured 3.0 x 1.8 cm. Apparent increase in size of this lesion is not likely related to contrast timing given the multiphase acquisition on prior CT, and is suspicious for residual viable/recurrent hepatic metastatic disease. Consider attention on short interval follow-up liver protocol CT or MRI with and without contrast to assess stability. 2. Otherwise similar to slightly decreased size of the multiple hypodense hepatic lesions seen on prior examination. 3. Stable small right lower lobe pulmonary nodules. No new or progressive findings in the chest. 4. No CT evidence of osseous metastatic disease. 5. Postsurgical change of prostatectomy. No evidence of local recurrence. 6.  Aortic Atherosclerosis (ICD10-I70.0).  EXAM: 07/25/2021 NUCLEAR MEDICINE WHOLE BODY BONE SCAN  TECHNIQUE: Whole body anterior and posterior images were obtained approximately 3 hours after intravenous injection of radiopharmaceutical.  RADIOPHARMACEUTICALS:  24.2 mCi Technetium-86mMDP IV  COMPARISON:  07/25/2021, 04/26/2020  FINDINGS: Anterior and posterior whole body planar images are obtained. Physiologic excretion of radiotracer within the kidneys and bladder.  Persistent radiotracer activity within the  cervical spine, lower lumbar spine, bilateral shoulders compatible with degenerative change, grossly stable since prior study.  There is a solitary focus of increased radiotracer activity involving the left anterior fifth costochondral junction. On corresponding CT images, there is a subtle area of sclerosis within the left anterior fifth rib image 44/2. Given the solitary nature of this finding, I would favor healed rib fracture rather than solitary metastatic disease. Continued follow-up may be useful.  No other abdomen foci of radiotracer uptake on this exam.  IMPRESSION: 1. Solitary focus of increased radiotracer activity in the left anterior fifth rib at the costochondral junction, corresponding to a sclerotic area on CT. Favor healed fracture over solitary metastatic disease. Continued follow-up is recommended. 2. Multifocal degenerative changes involving the cervical spine, lumbar spine, bilateral shoulders.   HISTORY:   Allergies:  Allergies  Allergen Reactions   Other Other (See Comments)    Grass Pollen     Current Medications: Current Outpatient Medications  Medication Sig Dispense Refill   amoxicillin-clavulanate (AUGMENTIN) 875-125 MG tablet Take 1 tablet by mouth 2 (two) times daily.     bicalutamide (CASODEX) 50 MG tablet Take 40 mg by mouth daily.     ondansetron (ZOFRAN) 4 MG tablet Take 4 mg by mouth every 8 (eight) hours as needed for nausea or vomiting.     oxyCODONE (OXY IR/ROXICODONE) 5 MG immediate release tablet Take 5 mg by mouth every 4 (four) hours as needed for severe pain.     pantoprazole (PROTONIX) 40 MG tablet pantoprazole 40 mg tablet,delayed release  TAKE 1 TABLET BY MOUTH TWICE DAILY WITH BREAKFAST AND EVENING MEAL     PROAIR HFA 108 (90 Base) MCG/ACT inhaler Inhale into the lungs.     prochlorperazine (COMPAZINE) 10 MG tablet Take 10 mg by mouth every 6 (six) hours as needed for nausea or vomiting.     temazepam (RESTORIL) 15 MG capsule  Take 15 mg by mouth at bedtime as needed for sleep.     traZODone (DESYREL) 50 MG tablet      No current facility-administered medications for this visit.    ASSESSMENT & PLAN:   Assessment:  1.  Stage III prostate cancer diagnosed in  April 2019 treated with radical prostatectomy and adjuvant radiation therapy.  He had been on leuprolide injections every 3 months, as well as bicalutamide since March 2021.   2.  Liver metastases, biopsy proven to be metastatic from his prostate cancer. He received 12 cycles of docetaxel/prednisone. His PSA has continued to decrease. CT imaging in March revealed stable liver lesions.   The PSA increased to 1.6 at Dr. Ara Kussmaul office in May. This is likely due to him being off bicalutamide.  He has not been compliant with bicalutamide, and PSA from June rose from 1.6 to 8.1.  We will draw a PSA today for a new baseline.   3.  Macrocytic anemia, which is stable.   4.  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  Plan:      We will draw a PSA today for a new baseline, as he has been noncompliant with bicalutamide due to increasing toxicities.  He will continue his Leupron injections through Dr. Nila Nephew, and is scheduled for his next dose on August 30th.  We reviewed alternative therapies today including enzalutamide as his disease was under good control with bicalutamide.  I encouraged him to consider a clinical trial if there is one available.  Other options would be to resume docetaxel since he really did not have neuropathy from this or cabazitaxel.  He wishes to get a second opinion from [facility], and so I will make the appropriate referral.  I have agreed to refill his inhaler as he is not scheduled with his new primary care physician until the end of September.  Once he is seen at [facility], we can bring him back to make a decision on treatment if he would like to continue it here. The patient understands the plans discussed today and is in agreement with them.   He knows to contact our office if he develops concerns prior to his next appointment.  ADDENDUM: His PSA has increased from 8.1 to 32.93 in approximately 1 month's time.  I will notify him.    I, Rita Ohara, am acting as scribe for Derwood Kaplan, MD  I have reviewed this report as typed by the medical scribe, and it is complete and accurate.  [name] MD [place] [facility]

## 2021-07-31 NOTE — Telephone Encounter (Signed)
Faxed referral and information to [facility].

## 2021-08-02 ENCOUNTER — Encounter: Payer: Self-pay | Admitting: Oncology

## 2021-08-06 ENCOUNTER — Telehealth: Payer: Self-pay

## 2021-08-06 ENCOUNTER — Other Ambulatory Visit: Payer: Self-pay

## 2021-08-06 NOTE — Telephone Encounter (Signed)
-----   Message from Derwood Kaplan, MD sent at 08/06/2021 11:05 AM EDT ----- Regarding: referral Appt with [facility] is 9/22 so I called their scheduling dept and requested sooner, I.e. 1 week if poss.. Would you FAX his latest PSA to [phone],  I don't think we had that result when records sent

## 2021-08-06 NOTE — Telephone Encounter (Signed)
Faxed PSA results to [facility]

## 2021-10-23 ENCOUNTER — Telehealth: Payer: Self-pay

## 2021-10-23 ENCOUNTER — Other Ambulatory Visit: Payer: Self-pay | Admitting: Pharmacist

## 2021-10-23 DIAGNOSIS — C61 Malignant neoplasm of prostate: Secondary | ICD-10-CM

## 2021-10-23 NOTE — Telephone Encounter (Addendum)
@  1226- I spoke with pt. He was at [facility] and states they went ahead and scheduled him for a Lupron injection there .  Dr Hinton Rao - He went to [facility] and we haven't heard back, he would need appt & labs   [facility] - No he needs to be seen. If he is going to [place], why can't they give it?   Ulice Dash, pharmacist: 11.25 mg is a every 12 week injection so definitely overdue.  We don't keep the trelstar but the equivalent dose of lupron would be 22.5 mg.   Mahdiya Mossberg,RN -Received call back from [employer] @ Dr Ara Kussmaul office. Pt received last Trlestar injection 11.25mg  on 04/09/2021.  RE: Lupron injections Received: Today Phy, Beverly Gust, RPH  Corene Cornea; Dairl Ponder, RN; Georgann Housekeeper, Thalia Bloodgood, PA-C Looks like he has gotten them at Dr. Ara Kussmaul office in the past, but I don't see a dose.  The note from [facility] recommended that he get them from Dr. Nila Nephew.  If we give here, we just need to find out dose of lupron and last given date.          RE: Lupron injections Received: Today Aldean Ast, Amberlee Garvey W, RN; Marvia Pickles, PA-C; Phy, Beverly Gust, [facility] I show his insurance as MCR/MCD; so no Josem Kaufmann would be required   Pt requesting that he start getting his Lupron injections here, so he doesn't have to travel to [facility] for it. He is past due, as last injection was in May 2022. I sent the message to [place].

## 2021-12-09 ENCOUNTER — Other Ambulatory Visit: Payer: Self-pay | Admitting: Oncology

## 2021-12-19 ENCOUNTER — Other Ambulatory Visit: Payer: Self-pay | Admitting: Hematology and Oncology

## 2021-12-19 DIAGNOSIS — C61 Malignant neoplasm of prostate: Secondary | ICD-10-CM

## 2021-12-19 NOTE — Progress Notes (Unsigned)
Received call from Alphonse Guild, patient coordinator, [phone], requesting we give docetaxel locally. Dr. Janyth Contes note in [place]. Scheduled patient for 1/13. Amy to contact patient. Left message for Alwyn Ren letting her know we had him scheduled.

## 2021-12-20 ENCOUNTER — Ambulatory Visit: Payer: Medicare Other | Admitting: Oncology

## 2021-12-20 ENCOUNTER — Other Ambulatory Visit: Payer: Medicare Other

## 2021-12-23 NOTE — Progress Notes (Signed)
[facility]  [address] [place],  [place]  [address] [phone]  Clinic Day:  12/24/2021  Referring physician: Reita May, NP  This document serves as a record of services personally performed by [name], MD. It was created on their behalf by [name],[name], a trained medical scribe. The creation of this record is based on the scribe's personal observations and the provider's statements to them.  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  He had symptoms of urinary frequency and a steadily rising PSA.  Biopsy confirmed prostatic adenocarcinoma with a Gleason 8, 4+ 4, in 100% of the biopsy submitted.  He also had cystoscopy at that time, which was benign.  He has had hematuria and hematospermia in the past.  CT imaging in May revealed mild prostatic enlargement and a bone scan was negative for metastatic disease.  He underwent radical prostatectomy and bilateral pelvic lymphadenectomy in May 2019.  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  There was extraprostatic extension at bilateral apices and positive margins at the left posterior lateral wall.  There was also infiltration of the seminal vesicles.  The tumor volume was approximately 50% of the gland.  Bilateral lymph nodes were clear.  He had a weight loss of 80 lb in the perioperative period.  Due to his aggressive prostate cancer and family history of malignancy, he underwent testing for hereditary cancer syndromes with the [employer] myRisk Hereditary Cancer Panel.  This did not reveal any clinically significant mutation or variants of uncertain significance.    He was recommended for adjuvant  radiation therapy.  He received 2720 cGy for 15 fractions and then stopped in August 2019.  In January 2020, he came in for 1 treatment and in May 2020 he went back on daily treatment and had the remaining 5220 cGy in 29 fractions for a total cumulative dose of 7940 cGy. PET scan in July 2020 revealed mild hypermetabolic activity with soft tissue thickening in the deep pelvis felt to be postsurgical in nature.  There was no evidence of nodal metastasis or distant metastasis.  CT abdomen and pelvis done in the emergency room August 2020 revealed two hypodense liver lesions, new compared to imaging from 2019, ranging from 16-25 mm, which were suspicious for liver metastases, but were PET negative the month prior.  He continued Lupron and bicalutamide was added in March. The PSA responded.    CT abdomen and pelvis revealed interval progression of liver metastases with the largest measuring 3 cm. Bone scan was negative for metastatic disease, but revealed degenerative changes. Ultrasound guided biopsy of the liver in June revealed metastatic carcinoma consistent with the patient's known primary prostatic cancer. He continued his hormonal therapy and Taxotere was added.  He did receive 12 doses, completed in February 2022, with good response and the PSA going down from 26 to as low as 0.38. CT abdomen and pelvis in December revealed mild improvement in the hepatic metastasis with the largest lesion now measuring 1.7 cm, previously 1.9 cm.  CT chest, abdomen and pelvis in March 2022 did reveal any evidence of locally recurrent mass or lymphadenopathy. The multiple low-attenuation metastatic lesions of the liver were  not significantly changed compared to prior examination.   Unfortunately patient could not tolerate the side effects of bicalutamide including tiredness and fatigue and states that he was intermittently compliant with the medication after April 2022. Repeat PSA in June 2022 showed that his PSA had  increased to 8.1. CT chest, abdomen and pelvis from August 2022 which showed interval increase in size of hypodense lesion in central right hepatic lobe measuring 2.7 X 1.7 cm. Other similar to slightly decreased size of multiple hypodense hepatic lesions were also noted. There was no evidence of osseous metastatic disease. Patient also had a nuclear medicine bone scan on the same day which showed a solitary focus of increased radiotracer activity involving the left fifth costochondral junction which per radiologist favors healed rib fracture rather than solitary metastatic disease. PSA was up to 32.8. At that time, the patient was referred to [facility] for a 2nd opinion and has continued treatment there. PSMA PET scan in November showed only activity in 2 liver lesions. He restarted ADT on November 23rd and started enzalutimide on November 25th, but did not tolerate it well and the PSA rose to 228 by late December. To bridge patient to PSMA treatment, re-treatment with docetaxel has been recommended.  INTERVAL HISTORY:  Jeffrey Lambert states that he continues Lupron injection every 6 months. He will be unable to receive the PSMA (pluvicto) treatment through [facility] until April. Therefore, we will plan to bridge with docetaxel until that time. We reviewed the rationale and alternatives, and the fact that he tolerated this well before. He states that he is doing fairly well and denies complaints other than mild back pain, which he rates as a 2/10. His  appetite is good, but he has lost 5 pounds since his last visit in August. He attributes this to stress.  He denies fever, chills or other signs of infection.  He denies nausea, vomiting, bowel issues, or abdominal pain.  He denies sore throat, cough, dyspnea, or chest pain.  REVIEW OF SYSTEMS:  Review of Systems  Constitutional: Negative.  Negative for appetite change, chills, fatigue, fever and unexpected weight change.  HENT:  Negative.    Eyes: Negative.    Respiratory: Negative.  Negative for chest tightness, cough, hemoptysis, shortness of breath and wheezing.   Cardiovascular: Negative.  Negative for chest pain, leg swelling and palpitations.  Gastrointestinal: Negative.  Negative for abdominal distention, abdominal pain, blood in stool, constipation, diarrhea, nausea and vomiting.  Endocrine: Negative.   Genitourinary: Negative.  Negative for difficulty urinating, dysuria, frequency and hematuria.   Musculoskeletal:  Positive for back pain (lower, currently mild). Negative for arthralgias, flank pain, gait problem and myalgias.  Skin: Negative.   Neurological: Negative.  Negative for dizziness, extremity weakness, gait problem, headaches, light-headedness, numbness, seizures and speech difficulty.  Hematological: Negative.   Psychiatric/Behavioral: Negative.  Negative for depression and sleep disturbance. The patient is not nervous/anxious.   All other systems reviewed and are negative.   VITALS:  Blood pressure 123/81, pulse 61, temperature 98.2 F (36.8 C), temperature source Oral, resp. rate 20, height 5' 7"  (1.702 m), weight 130 lb 4.8 oz (59.1 kg), SpO2 98 %.  Wt Readings from Last 3 Encounters:  12/24/21 130 lb 4.8 oz (59.1 kg)  07/31/21 135 lb 12.8 oz (61.6 kg)  05/30/21 138 lb 11.2 oz (62.9 kg)    Body mass index is 20.41 kg/m.  Performance status (ECOG): 1 - Symptomatic but completely ambulatory  PHYSICAL EXAM:  Physical Exam Constitutional:  General: He is not in acute distress.    Appearance: Normal appearance. He is normal weight.  HENT:     Head: Normocephalic and atraumatic.  Eyes:     General: No scleral icterus.    Extraocular Movements: Extraocular movements intact.     Conjunctiva/sclera: Conjunctivae normal.     Pupils: Pupils are equal, round, and reactive to light.  Cardiovascular:     Rate and Rhythm: Normal rate and regular rhythm.     Pulses: Normal pulses.     Heart sounds: Normal heart sounds.  No murmur heard.   No friction rub. No gallop.  Pulmonary:     Effort: Pulmonary effort is normal. No respiratory distress.     Breath sounds: Normal breath sounds.  Abdominal:     General: Bowel sounds are normal. There is no distension.     Palpations: Abdomen is soft. There is no hepatomegaly (liver palpable at the right costal margin), splenomegaly or mass.     Tenderness: There is no abdominal tenderness.  Musculoskeletal:        General: Normal range of motion.     Cervical back: Normal range of motion and neck supple.     Right lower leg: No edema.     Left lower leg: No edema.  Lymphadenopathy:     Cervical: No cervical adenopathy.  Skin:    General: Skin is warm and dry.  Neurological:     General: No focal deficit present.     Mental Status: He is alert and oriented to person, place, and time. Mental status is at baseline.  Psychiatric:        Mood and Affect: Mood normal.        Behavior: Behavior normal.        Thought Content: Thought content normal.        Judgment: Judgment normal.   LABS:   CBC Latest Ref Rng & Units 12/24/2021 07/31/2021 05/30/2021  WBC - 6.5 4.8 6.4  Hemoglobin 13.5 - 17.5 13.8 13.4(A) 13.2(A)  Hematocrit 41 - 53 40(A) 39(A) 37(A)  Platelets 150 - 399 277 210 211   CMP Latest Ref Rng & Units 12/24/2021 07/31/2021 05/30/2021  Glucose 70 - 99 mg/dL - - -  BUN 4 - 21 22(A) 13 13  Creatinine 0.6 - 1.3 0.9 0.9 0.9  Sodium 137 - 147 139 139 138  Potassium 3.4 - 5.3 4.2 4.2 3.8  Chloride 99 - 108 105 107 103  CO2 13 - 22 28(A) 20 27(A)  Calcium 8.7 - 10.7 8.7 8.8 8.8  Total Protein 6.5 - 8.1 g/dL - - -  Total Bilirubin 0.3 - 1.2 mg/dL - - -  Alkaline Phos 25 - 125 62 69 64  AST 14 - 40 40 49(A) 28  ALT 10 - 40 32 55(A) 29    Lab Results  Component Value Date   PSA1 8.1 (H) 05/30/2021    STUDIES:  No results found.    HISTORY:   Allergies:  Allergies  Allergen Reactions   Grass Pollen([id number] [facility])     Other reaction(s): Cough  (ALLERGY/intolerance)   Other Other (See Comments)    Grass Pollen  Other reaction(s): Other (See Comments) Sneezing, runny nose    Current Medications: Current Outpatient Medications  Medication Sig Dispense Refill   enzalutamide (XTANDI) 40 MG tablet Take by mouth.     benzonatate (TESSALON) 100 MG capsule Take 1 capsule by mouth every 8 (eight) hours.     loratadine (  CLARITIN) 10 MG tablet Take by mouth.     ondansetron (ZOFRAN) 4 MG tablet Take 4 mg by mouth every 8 (eight) hours as needed for nausea or vomiting.     oxyCODONE (OXY IR/ROXICODONE) 5 MG immediate release tablet Take 5 mg by mouth every 4 (four) hours as needed for severe pain.     pantoprazole (PROTONIX) 40 MG tablet pantoprazole 40 mg tablet,delayed release  TAKE 1 TABLET BY MOUTH TWICE DAILY WITH BREAKFAST AND EVENING MEAL     predniSONE (DELTASONE) 5 MG tablet Take 5 mg by mouth daily with breakfast.     PROAIR HFA 108 (90 Base) MCG/ACT inhaler Inhale 2 puffs into the lungs every 6 (six) hours as needed for shortness of breath. 1 each 5   prochlorperazine (COMPAZINE) 10 MG tablet Take 10 mg by mouth every 6 (six) hours as needed for nausea or vomiting.     temazepam (RESTORIL) 15 MG capsule Take 15 mg by mouth at bedtime as needed for sleep.     traZODone (DESYREL) 50 MG tablet      No current facility-administered medications for this visit.    ASSESSMENT & PLAN:   Assessment:  1.  Stage III prostate cancer diagnosed in April 2019 treated with radical prostatectomy and adjuvant radiation therapy.  He is now on leuprolide injections every 6 months through Dr. Emeterio Reeve, with the last one given 10/24/21.  He is due for the next one 04/16/22.   2.  Liver metastases, biopsy proven to be metastatic from his prostate cancer. He received 12 cycles of docetaxel/prednisone, and his PSA continued to decrease. CT imaging in March revealed stable liver lesions. However, he did not tolerate, and was not complaint with  bicalutamide and his PSA continued to increase to 32.8 in August 2022. At that time the patient was referred to [facility] for a 2nd opinion and has continued treatment there. PSMA PET scan in November showed only activity in 2 liver lesions He restarted ADT on November 23rd and started enzalutimide on November 25th, but did not tolerate it well and the PSA rose to 228 by late December. To bridge patient to PSMA treatment with Pluvicto, re-treatment with docetaxel has been recommended which will be carried out here.   Plan:      [place] has returned to our clinic in order to bridge treatment as he is scheduled to start [place] treatment with Pluvicto in April through [facility]. We will therefore plan to re-treat with docetaxel until that time. This was reviewed with the patient today and he is in agreement. We will plan to get this started next week. I have also suggested a bone density scan since he has been on androgen depravation therapy for years. The patient understands the plans discussed today and is in agreement with them.  He knows to contact our office if he develops concerns prior to his next appointment.    I, Rita Ohara, am acting as scribe for Derwood Kaplan, MD  I have reviewed this report as typed by the medical scribe, and it is complete and accurate.  [name] MD [place] at [facility]

## 2021-12-24 ENCOUNTER — Inpatient Hospital Stay: Payer: Medicare Other

## 2021-12-24 ENCOUNTER — Other Ambulatory Visit: Payer: Self-pay | Admitting: Oncology

## 2021-12-24 ENCOUNTER — Encounter: Payer: Self-pay | Admitting: Oncology

## 2021-12-24 ENCOUNTER — Inpatient Hospital Stay: Payer: Medicare Other | Attending: Oncology | Admitting: Oncology

## 2021-12-24 ENCOUNTER — Other Ambulatory Visit: Payer: Self-pay

## 2021-12-24 ENCOUNTER — Other Ambulatory Visit: Payer: Self-pay | Admitting: Hematology and Oncology

## 2021-12-24 VITALS — BP 123/81 | HR 61 | Temp 98.2°F | Resp 20 | Ht 67.0 in | Wt 130.3 lb

## 2021-12-24 DIAGNOSIS — Z79899 Other long term (current) drug therapy: Secondary | ICD-10-CM | POA: Diagnosis not present

## 2021-12-24 DIAGNOSIS — C61 Malignant neoplasm of prostate: Secondary | ICD-10-CM

## 2021-12-24 DIAGNOSIS — C787 Secondary malignant neoplasm of liver and intrahepatic bile duct: Secondary | ICD-10-CM

## 2021-12-24 LAB — BASIC METABOLIC PANEL
BUN: 22 — AB (ref 4–21)
CO2: 28 — AB (ref 13–22)
Chloride: 105 (ref 99–108)
Creatinine: 0.9 (ref 0.6–1.3)
Glucose: 103
Potassium: 4.2 (ref 3.4–5.3)
Sodium: 139 (ref 137–147)

## 2021-12-24 LAB — CBC AND DIFFERENTIAL
HCT: 40 — AB (ref 41–53)
Hemoglobin: 13.8 (ref 13.5–17.5)
Neutrophils Absolute: 3.77
Platelets: 277 (ref 150–399)
WBC: 6.5

## 2021-12-24 LAB — HEPATIC FUNCTION PANEL
ALT: 32 (ref 10–40)
AST: 40 (ref 14–40)
Alkaline Phosphatase: 62 (ref 25–125)
Bilirubin, Total: 0.6

## 2021-12-24 LAB — COMPREHENSIVE METABOLIC PANEL
Albumin: 4.3 (ref 3.5–5.0)
Calcium: 8.7 (ref 8.7–10.7)

## 2021-12-24 LAB — CBC: RBC: 4.22 (ref 3.87–5.11)

## 2021-12-24 NOTE — Progress Notes (Signed)
START OFF PATHWAY REGIMEN - Prostate   [id number]:Docetaxel 75 mg/m2 IV D1  + Prednisone 5 mg PO BID D1-21 q21 Days:   A cycle is every 21 days:     Prednisone      Docetaxel   **Always confirm dose/schedule in your pharmacy ordering system**  Patient Characteristics: Adenocarcinoma, Recurrent/New Systemic Disease, Castration Resistant, M1, Prior Novel Hormonal Agent, No Molecular Alteration or Targeted Therapy Exhausted, Prior Taxane and PSMA-Positive Histology: Adenocarcinoma Therapeutic Status: Recurrent/New Systemic Disease  Intent of Therapy: Non-Curative / Palliative Intent, Discussed with Patient

## 2021-12-24 NOTE — Progress Notes (Signed)
Patient on plan of care prior to pathways. 

## 2021-12-25 ENCOUNTER — Other Ambulatory Visit: Payer: Self-pay | Admitting: Oncology

## 2021-12-25 ENCOUNTER — Encounter: Payer: Self-pay | Admitting: Oncology

## 2021-12-25 ENCOUNTER — Ambulatory Visit: Payer: Medicare Other | Admitting: Oncology

## 2021-12-25 ENCOUNTER — Telehealth: Payer: Self-pay

## 2021-12-25 LAB — PROSTATE-SPECIFIC AG, SERUM (LABCORP): Prostate Specific Ag, Serum: 383 ng/mL — ABNORMAL HIGH (ref 0.0–4.0)

## 2021-12-25 MED ORDER — PREDNISONE 5 MG PO TABS: 5.0000 mg | ORAL_TABLET | Freq: Two times a day (BID) | ORAL | 11 refills | Status: DC

## 2021-12-25 MED ORDER — PROCHLORPERAZINE MALEATE 10 MG PO TABS: 10.0000 mg | ORAL_TABLET | Freq: Four times a day (QID) | ORAL | 1 refills | Status: DC | PRN

## 2021-12-25 MED ORDER — ONDANSETRON HCL 8 MG PO TABS: 8.0000 mg | ORAL_TABLET | Freq: Two times a day (BID) | ORAL | 1 refills | Status: DC | PRN

## 2021-12-25 MED ORDER — DEXAMETHASONE 4 MG PO TABS: 8.0000 mg | ORAL_TABLET | Freq: Two times a day (BID) | ORAL | 1 refills | Status: DC

## 2021-12-25 NOTE — Telephone Encounter (Signed)
-----   Message from Derwood Kaplan, MD sent at 12/25/2021  2:17 PM EST ----- Regarding: call His PSA is up to 383, so we'll get the Taxotere started next week

## 2021-12-27 ENCOUNTER — Encounter: Payer: Self-pay | Admitting: Oncology

## 2021-12-30 ENCOUNTER — Other Ambulatory Visit: Payer: Self-pay | Admitting: Pharmacist

## 2021-12-30 NOTE — Progress Notes (Signed)
..  Pharmacist Chemotherapy Monitoring - Initial Assessment    Anticipated start date: 01/01/22  The following has been reviewed per standard work regarding the patient's treatment regimen: The patient's diagnosis, treatment plan and drug doses, and organ/hematologic function Lab orders and baseline tests specific to treatment regimen  The treatment plan start date, drug sequencing, and pre-medications Prior authorization status  Patient's documented medication list, including drug-drug interaction screen and prescriptions for anti-emetics and supportive care specific to the treatment regimen The drug concentrations, fluid compatibility, administration routes, and timing of the medications to be used The patient's access for treatment and lifetime cumulative dose history, if applicable  The patient's medication allergies and previous infusion related reactions, if applicable   Changes made to treatment plan:  switch to insurance preferred biosimilar  Follow up needed:  N/A   Juanetta Beets, [facility], 12/30/2021  5:08 PM

## 2021-12-31 MED FILL — Docetaxel Soln for IV Infusion 160 MG/16ML: INTRAVENOUS | Qty: 13 | Status: AC

## 2021-12-31 MED FILL — Dexamethasone Sodium Phosphate Inj 100 MG/10ML: INTRAMUSCULAR | Qty: 1 | Status: AC

## 2022-01-01 ENCOUNTER — Inpatient Hospital Stay: Payer: Medicare Other

## 2022-01-01 ENCOUNTER — Telehealth: Payer: Self-pay

## 2022-01-01 ENCOUNTER — Encounter: Payer: Self-pay | Admitting: Oncology

## 2022-01-01 NOTE — Telephone Encounter (Signed)
1000 called patient, no answer, and no way to leave VM.

## 2022-01-02 ENCOUNTER — Telehealth: Payer: Self-pay

## 2022-01-02 ENCOUNTER — Encounter: Payer: Self-pay | Admitting: Oncology

## 2022-01-02 NOTE — Telephone Encounter (Addendum)
01/03/22 Kelli,PA, sent in eRx for oxycodone.     01/02/22 I called pt to find out where he is hurting. Pt states it is his lower back and hips. He describes it as a dull ache, and it gets worse with movement. He states he has had this pain for long while, but tries not to complain. He is asking if he can get a little something to help with the pain. He has used oxycodone 5mg  in the past (ordered by [facility] doctor). Robin, pharmacist, states that if we send oxycodone 5mg  in, please order capsules as tablets are not available.

## 2022-01-03 ENCOUNTER — Encounter: Payer: Self-pay | Admitting: Oncology

## 2022-01-03 ENCOUNTER — Ambulatory Visit: Payer: Medicare Other

## 2022-01-03 ENCOUNTER — Other Ambulatory Visit: Payer: Self-pay | Admitting: Hematology and Oncology

## 2022-01-03 MED ORDER — OXYCODONE HCL 5 MG PO TABS: 5.0000 mg | ORAL_TABLET | ORAL | 0 refills | Status: DC | PRN

## 2022-01-06 ENCOUNTER — Telehealth: Payer: Self-pay | Admitting: Oncology

## 2022-01-06 NOTE — Telephone Encounter (Signed)
01/06/30 Spoke with patient about rescheduling appts.The dates given did not work for him due to [place] appt.

## 2022-01-08 ENCOUNTER — Other Ambulatory Visit: Payer: Medicare Other

## 2022-01-09 ENCOUNTER — Ambulatory Visit: Payer: Medicare Other

## 2022-01-10 ENCOUNTER — Other Ambulatory Visit: Payer: Self-pay

## 2022-01-10 ENCOUNTER — Encounter: Payer: Self-pay | Admitting: Hematology and Oncology

## 2022-01-10 ENCOUNTER — Inpatient Hospital Stay: Payer: Medicare Other | Attending: Oncology

## 2022-01-10 ENCOUNTER — Inpatient Hospital Stay (INDEPENDENT_AMBULATORY_CARE_PROVIDER_SITE_OTHER): Payer: Medicare Other | Admitting: Hematology and Oncology

## 2022-01-10 VITALS — BP 128/78 | HR 59 | Temp 97.1°F | Resp 14 | Ht 67.0 in

## 2022-01-10 DIAGNOSIS — R109 Unspecified abdominal pain: Secondary | ICD-10-CM | POA: Insufficient documentation

## 2022-01-10 DIAGNOSIS — Z5189 Encounter for other specified aftercare: Secondary | ICD-10-CM | POA: Insufficient documentation

## 2022-01-10 DIAGNOSIS — C61 Malignant neoplasm of prostate: Secondary | ICD-10-CM

## 2022-01-10 DIAGNOSIS — R102 Pelvic and perineal pain: Secondary | ICD-10-CM

## 2022-01-10 DIAGNOSIS — M545 Low back pain, unspecified: Secondary | ICD-10-CM | POA: Diagnosis not present

## 2022-01-10 DIAGNOSIS — C787 Secondary malignant neoplasm of liver and intrahepatic bile duct: Secondary | ICD-10-CM | POA: Insufficient documentation

## 2022-01-10 DIAGNOSIS — Z800 Family history of malignant neoplasm of digestive organs: Secondary | ICD-10-CM | POA: Insufficient documentation

## 2022-01-10 DIAGNOSIS — R32 Unspecified urinary incontinence: Secondary | ICD-10-CM | POA: Insufficient documentation

## 2022-01-10 DIAGNOSIS — Z5112 Encounter for antineoplastic immunotherapy: Secondary | ICD-10-CM | POA: Insufficient documentation

## 2022-01-10 DIAGNOSIS — R1031 Right lower quadrant pain: Secondary | ICD-10-CM | POA: Insufficient documentation

## 2022-01-10 DIAGNOSIS — Z809 Family history of malignant neoplasm, unspecified: Secondary | ICD-10-CM | POA: Insufficient documentation

## 2022-01-10 DIAGNOSIS — Z79899 Other long term (current) drug therapy: Secondary | ICD-10-CM | POA: Insufficient documentation

## 2022-01-10 DIAGNOSIS — K5900 Constipation, unspecified: Secondary | ICD-10-CM | POA: Insufficient documentation

## 2022-01-10 HISTORY — DX: Secondary malignant neoplasm of liver and intrahepatic bile duct: C78.7

## 2022-01-10 HISTORY — DX: Low back pain, unspecified: M54.50

## 2022-01-10 LAB — BASIC METABOLIC PANEL
BUN: 19 (ref 4–21)
CO2: 29 — AB (ref 13–22)
Chloride: 104 (ref 99–108)
Creatinine: 0.8 (ref 0.6–1.3)
Glucose: 80
Potassium: 3.5 (ref 3.4–5.3)
Sodium: 137 (ref 137–147)

## 2022-01-10 LAB — CBC AND DIFFERENTIAL
HCT: 40 — AB (ref 41–53)
Hemoglobin: 13.8 (ref 13.5–17.5)
Neutrophils Absolute: 4.65
Platelets: 281 (ref 150–399)
WBC: 7.5

## 2022-01-10 LAB — CBC
MCV: 97 — AB (ref 80–94)
RBC: 4.19 (ref 3.87–5.11)

## 2022-01-10 LAB — COMPREHENSIVE METABOLIC PANEL
Albumin: 4.1 (ref 3.5–5.0)
Calcium: 8.7 (ref 8.7–10.7)

## 2022-01-10 LAB — HEPATIC FUNCTION PANEL
ALT: 42 — AB (ref 10–40)
AST: 46 — AB (ref 14–40)
Alkaline Phosphatase: 71 (ref 25–125)
Bilirubin, Total: 0.5

## 2022-01-10 NOTE — Progress Notes (Signed)
[place]  [address] [place],  [place]  [address] [phone]  Clinic Day:  01/10/2022  Referring physician: Reita May, NP  ASSESSMENT & PLAN:   Assessment & Plan: Liver metastasis [facility]) Liver metastases diagnosed in June 2021, biopsy proven to be metastatic from his prostate cancer. He had a good response to12 cycles of docetaxel/prednisone, but was not complaint with maintenance bicalutamide. PSA increased from March 2022 to August 2022 up to 38.  He was then referred to [facility] for a 2nd opinion. PSMA PET scan in November showed only activity in 2 liver lesions He restarted ADT on November 23rd and started enzalutimide on November 25th, but did not tolerate it well and the PSA rose to 228 by late December. To bridge patient to PSMA treatment with Pluvicto, re-treatment with docetaxel/prednisone has been recommended which will be carried out here. The patient missed his chemotherapy appointment on January 25th, so is now scheduled for docetaxel on February 6th.  He will proceed with docetaxel on Monday.  Back pain, lumbosacral He has had chronic lower back pain attributed to degenerative disease.  There is no evidence of metastatic cancer on imaging in August or November.  However, the pain has worsened and he is having low abdominal/bladder pain with an episode of right groin pain into the right thigh.  I therefore recommend an MRI of the lumbar spine for further evaluation.  A urinalysis is pending from today.   I will see him back in 3 weeks with a CBC, comprehensive panel and PSA for repeat clinical assessment prior to a 2nd cycle of docetaxel/prednisone.  The patient understands the plans discussed today and is in agreement with them.  He knows to contact our office if he develops concerns prior to his next appointment.     Marvia Pickles, PA-C  [facility] AT [facility] [place] [address] Dept: [phone] Dept Fax: [phone]   Orders Placed This Encounter  Procedures   MR Lumbar Spine W Contrast    Standing Status:   Future    Standing Expiration Date:   01/10/2023    Order Specific Question:   If indicated for the ordered procedure, I authorize the administration of contrast media per Radiology protocol    Answer:   Yes    Order Specific Question:   What is the patient's sedation requirement?    Answer:   No Sedation    Order Specific Question:   Does the patient have a pacemaker or implanted devices?    Answer:   No    Order Specific Question:   Preferred imaging location?    Answer:   External   Miscellaneous test (send-out)    Standing Status:   Future    Number of Occurrences:   1    Standing Expiration Date:   01/10/2023    Order Specific Question:   Test name / description:    Answer:   Urinalysis and urine culture to [facility]   CBC and differential    This external order was created through the Results Console.   CBC    This external order was created through the Results Console.   Basic metabolic panel    This external order was created through the Results Console.   Comprehensive metabolic panel    This external order was created through the Results Console.   Hepatic function panel    This  external order was created through the Results Console.   CBC    This order was created through External Result Entry      CHIEF COMPLAINT:  CC: Abdominal pain and bladder pain  Current Treatment: Palliative docetaxel/prednisone  HISTORY OF PRESENT ILLNESS:  Theophil Thivierge is a 66 y.o. 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  He had symptoms of urinary frequency and a steadily rising PSA.  Biopsy confirmed prostatic adenocarcinoma with a Gleason 8, 4+ 4, in 100% of the biopsy submitted.  He also had cystoscopy at that time, which was benign.  He has had hematuria and  hematospermia in the past.  CT imaging in May revealed mild prostatic enlargement and a bone scan was negative for metastatic disease.  He underwent radical prostatectomy and bilateral pelvic lymphadenectomy in May 2019.  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  There was extraprostatic extension at bilateral apices and positive margins at the left posterior lateral wall.  There was also infiltration of the seminal vesicles.  The tumor volume was approximately 50% of the gland.  Bilateral lymph nodes were clear.  He had a weight loss of 80 lb in the perioperative period.  Due to his aggressive prostate cancer and family history of malignancy, he underwent testing for hereditary cancer syndromes with the [employer] myRisk Hereditary Cancer Panel.  This did not reveal any clinically significant mutation or variants of uncertain significance.     He was recommended for adjuvant radiation therapy.  He received 2720 cGy for 15 fractions and then stopped in August 2019.  In January 2020, he came in for 1 treatment and in May 2020 he went back on daily treatment and had the remaining 5220 cGy in 29 fractions for a total cumulative dose of 7940 cGy. PET scan in July 2020 revealed mild hypermetabolic activity with soft tissue thickening in the deep pelvis felt to be postsurgical in nature.  There was no evidence of nodal metastasis or distant metastasis.  CT abdomen and pelvis done in the emergency room August 2020 revealed two hypodense liver lesions, new compared to imaging from 2019, ranging from 16-25 mm, which were suspicious for liver metastases, but were PET negative the month prior.  He continued Lupron and bicalutamide was added in March. The PSA responded.    CT abdomen and pelvis revealed interval progression of liver metastases with the largest measuring 3 cm. Bone scan was negative for metastatic disease, but revealed degenerative changes. Ultrasound guided biopsy  of the liver in June revealed metastatic carcinoma consistent with the patient's known primary prostatic cancer. He continued his hormonal therapy and Taxotere was added.  He did receive 12 doses, completed in February 2022, with good response and the PSA going down from 26 to as low as 0.38. CT abdomen and pelvis in December revealed mild improvement in the hepatic metastasis with the largest lesion now measuring 1.7 cm, previously 1.9 cm.  CT chest, abdomen and pelvis in March 2022 did reveal any evidence of locally recurrent mass or lymphadenopathy. The multiple low-attenuation metastatic lesions of the liver were not significantly changed compared to prior examination.    Unfortunately patient could not tolerate the side effects of bicalutamide including tiredness and fatigue and states that he was intermittently compliant with the medication after April 2022. Repeat PSA in June 2022 showed that his PSA had increased to 8.1. CT chest, abdomen and pelvis from  August 2022 which showed interval increase in size of hypodense lesion in central right hepatic lobe measuring 2.7 X 1.7 cm. Other similar to slightly decreased size of multiple hypodense hepatic lesions were also noted. There was no evidence of osseous metastatic disease. Patient also had a nuclear medicine bone scan on the same day which showed a solitary focus of increased radiotracer activity involving the left fifth costochondral junction which per radiologist favors healed rib fracture rather than solitary metastatic disease. PSA was up to 32.8. At that time, the patient was referred to [facility] for a 2nd opinion and has continued treatment there. PSMA PET scan in November showed only activity in 2 liver lesions. He restarted ADT on November 23rd and started enzalutamide on November 25th, but did not tolerate it well and the PSA rose to 228 by late December. To bridge patient to PSMA treatment, re-treatment with docetaxel has been recommended.  He  continues Lupron every 6 months. He will be unable to receive the PSMA (Pluvicto) treatment through [facility] until April.  He is therefore set up to receive docetaxel/prednisone until that time.  He missed his chemotherapy appointment on January 25th, so is now scheduled for February 6th.  INTERVAL HISTORY:  Iwao is added to the schedule today as he is here for labs and reports several issues.  Since his last visit, he has had increasing low back pain, lower, abdominal/bladder pain, and an episode of pain in his right groin into his right thigh. He states he has been less active than previous.  He is using Naprosyn and occasionally oxycodone as needed.  He states he feels constipated, but his stool is soft and difficult to evacuate.  He denies nausea, vomiting or diarrhea.  He states he is drinking V8 high-fiber.  He denies fatigue.  He denies fevers or chills. His appetite is good. His weight  was not taken today .   REVIEW OF SYSTEMS:  Review of Systems  Constitutional:  Negative for appetite change, chills, fatigue, fever and unexpected weight change.  HENT:   Negative for lump/mass, mouth sores and sore throat.   Respiratory:  Negative for cough and shortness of breath.   Cardiovascular:  Negative for chest pain and leg swelling.  Gastrointestinal:  Positive for abdominal pain and constipation (stool is soft difficult to excrete). Negative for diarrhea, nausea and vomiting.  Genitourinary:  Positive for bladder incontinence (chronic) and pelvic pain (new). Negative for difficulty urinating, dysuria, frequency and hematuria.   Musculoskeletal:  Negative for arthralgias, back pain and myalgias.  Skin:  Negative for itching, rash and wound.  Neurological:  Negative for dizziness, extremity weakness, headaches, light-headedness and numbness.  Hematological:  Negative for adenopathy.  Psychiatric/Behavioral:  Negative for depression and sleep disturbance. The patient is not nervous/anxious.      VITALS:  Blood pressure 128/78, pulse (!) 59, temperature (!) 97.1 F (36.2 C), temperature source Oral, resp. rate 14, height 5' 7"  (1.702 m), SpO2 99 %.  Wt Readings from Last 3 Encounters:  12/24/21 130 lb 4.8 oz (59.1 kg)  07/31/21 135 lb 12.8 oz (61.6 kg)  05/30/21 138 lb 11.2 oz (62.9 kg)    Body mass index is 20.41 kg/m.  Performance status (ECOG): 1 - Symptomatic but completely ambulatory  PHYSICAL EXAM:  Physical Exam Vitals and nursing note reviewed.  Constitutional:      General: He is not in acute distress.    Appearance: Normal appearance. He is normal weight.  HENT:  Head: Normocephalic and atraumatic.     Mouth/Throat:     Mouth: Mucous membranes are moist.     Pharynx: Oropharynx is clear. No oropharyngeal exudate or posterior oropharyngeal erythema.  Eyes:     General: No scleral icterus.    Extraocular Movements: Extraocular movements intact.     Conjunctiva/sclera: Conjunctivae normal.     Pupils: Pupils are equal, round, and reactive to light.  Cardiovascular:     Rate and Rhythm: Normal rate and regular rhythm.     Heart sounds: Normal heart sounds. No murmur heard.   No friction rub. No gallop.  Pulmonary:     Effort: Pulmonary effort is normal.     Breath sounds: Normal breath sounds. No wheezing, rhonchi or rales.  Abdominal:     General: Bowel sounds are normal. There is no distension.     Palpations: Abdomen is soft. There is no hepatomegaly, splenomegaly or mass.     Tenderness: There is abdominal tenderness (right groin).  Musculoskeletal:        General: Normal range of motion.     Cervical back: Normal range of motion and neck supple. No tenderness.     Right lower leg: No edema.     Left lower leg: No edema.  Lymphadenopathy:     Cervical: No cervical adenopathy.     Upper Body:     Right upper body: No supraclavicular or axillary adenopathy.     Left upper body: No supraclavicular or axillary adenopathy.     Lower Body: No right  inguinal adenopathy. No left inguinal adenopathy.  Skin:    General: Skin is warm and dry.     Coloration: Skin is not jaundiced.     Findings: No rash.  Neurological:     Mental Status: He is alert and oriented to person, place, and time.     Cranial Nerves: No cranial nerve deficit.     Motor: No weakness.     Gait: Gait normal.  Psychiatric:        Mood and Affect: Mood normal.        Behavior: Behavior normal.        Thought Content: Thought content normal.    LABS:   CBC Latest Ref Rng & Units 01/10/2022 12/24/2021 07/31/2021  WBC - 7.5 6.5 4.8  Hemoglobin 13.5 - 17.5 13.8 13.8 13.4(A)  Hematocrit 41 - 53 40(A) 40(A) 39(A)  Platelets 150 - 399 281 277 210   CMP Latest Ref Rng & Units 01/10/2022 12/24/2021 07/31/2021  Glucose 70 - 99 mg/dL - - -  BUN 4 - 21 19 22(A) 13  Creatinine 0.6 - 1.3 0.8 0.9 0.9  Sodium 137 - 147 137 139 139  Potassium 3.4 - 5.3 3.5 4.2 4.2  Chloride 99 - 108 104 105 107  CO2 13 - 22 29(A) 28(A) 20  Calcium 8.7 - 10.7 8.7 8.7 8.8  Total Protein 6.5 - 8.1 g/dL - - -  Total Bilirubin 0.3 - 1.2 mg/dL - - -  Alkaline Phos 25 - 125 71 62 69  AST 14 - 40 46(A) 40 49(A)  ALT 10 - 40 42(A) 32 55(A)     No results found for: CEA1 / No results found for: CEA1 Lab Results  Component Value Date   PSA1 383.0 (H) 12/24/2021   No results found for: LFY101 No results found for: BPZ025  No results found for: TOTALPROTELP, ALBUMINELP, A1GS, A2GS, BETS, BETA2SER, GAMS, MSPIKE, SPEI No results found for:  TIBC, FERRITIN, IRONPCTSAT No results found for: LDH  STUDIES:  No results found.    HISTORY:   Past Medical History:  Diagnosis Date   Allergy    Back pain, lumbosacral 01/10/2022   Depression    Dupuytren's contracture    GERD (gastroesophageal reflux disease)    Liver metastasis ([place]) 01/10/2022   Malignant neoplasm of prostate ([place])    Osteoarthritis    Secondary malignant neoplasm of liver ([place])    Secondary malignant neoplasm of liver  [facility])     Past Surgical History:  Procedure Laterality Date   APPENDECTOMY  1970   HERNIA REPAIR  03/2020   PROSTATECTOMY  2019    Family History  Problem Relation Age of Onset   Colon cancer Brother 27   Cancer Maternal Uncle    Colon cancer Maternal Grandfather 62    Social History:  reports that he has quit smoking. He has never used smokeless tobacco. He reports that he does not drink alcohol. No history on file for drug use.The patient is alone today.  Allergies:  Allergies  Allergen Reactions   Grass Pollen([id number]-Swt Vern)     Other reaction(s): Cough (ALLERGY/intolerance)   Other Other (See Comments)    Grass Pollen  Other reaction(s): Other (See Comments) Sneezing, runny nose    Current Medications: Current Outpatient Medications  Medication Sig Dispense Refill   benzonatate (TESSALON) 100 MG capsule Take 1 capsule by mouth every 8 (eight) hours.     dexamethasone (DECADRON) 4 MG tablet Take 2 tablets (8 mg total) by mouth 2 (two) times daily. Start the day before Taxotere. Then daily after chemo for 2 days. 30 tablet 1   loratadine (CLARITIN) 10 MG tablet Take by mouth.     ondansetron (ZOFRAN) 4 MG tablet Take 4 mg by mouth every 8 (eight) hours as needed for nausea or vomiting.     ondansetron (ZOFRAN) 8 MG tablet Take 1 tablet (8 mg total) by mouth 2 (two) times daily as needed for refractory nausea / vomiting. 30 tablet 1   oxyCODONE (OXY IR/ROXICODONE) 5 MG immediate release tablet Take 1 tablet (5 mg total) by mouth every 4 (four) hours as needed for severe pain. 100 tablet 0   pantoprazole (PROTONIX) 40 MG tablet pantoprazole 40 mg tablet,delayed release  TAKE 1 TABLET BY MOUTH TWICE DAILY WITH BREAKFAST AND EVENING MEAL     predniSONE (DELTASONE) 5 MG tablet Take 5 mg by mouth daily with breakfast.     predniSONE (DELTASONE) 5 MG tablet Take 1 tablet (5 mg total) by mouth in the morning and at bedtime. 60 tablet 11   PROAIR HFA 108 (90  Base) MCG/ACT inhaler Inhale 2 puffs into the lungs every 6 (six) hours as needed for shortness of breath. 1 each 5   prochlorperazine (COMPAZINE) 10 MG tablet Take 10 mg by mouth every 6 (six) hours as needed for nausea or vomiting.     prochlorperazine (COMPAZINE) 10 MG tablet Take 1 tablet (10 mg total) by mouth every 6 (six) hours as needed (Nausea or vomiting). 30 tablet 1   temazepam (RESTORIL) 15 MG capsule Take 15 mg by mouth at bedtime as needed for sleep.     traZODone (DESYREL) 50 MG tablet      No current facility-administered medications for this visit.

## 2022-01-10 NOTE — Assessment & Plan Note (Addendum)
Liver metastases diagnosed in June 2021, biopsy proven to be metastatic from his prostate cancer. He had a good response to12 cycles ofdocetaxel/prednisone, but was not complaint with maintenance bicalutamide. PSA increased from March 2022 to August 2022 up to 38.  He was then referred to [facility] for a 2nd opinion. PSMA PET scan in November showed only activity in 2 liver lesions He restarted ADT on November 23rd and started enzalutimide on November 25th, but did not tolerate it well and the PSA rose to 228 by late December. To bridge patient to PSMA treatment with Pluvicto, re-treatment with docetaxel/prednisone has been recommended which will be carried out here. The patient missed his chemotherapy appointment on January 25th, so is now scheduled for docetaxel on February 6th.  He will proceed with docetaxel on Monday.

## 2022-01-10 NOTE — Assessment & Plan Note (Signed)
He has had chronic lower back pain attributed to degenerative disease.  There is no evidence of metastatic cancer on imaging in August or November.  However, the pain has worsened and he is having low abdominal/bladder pain with an episode of right groin pain into the right thigh.  I therefore recommend an MRI of the lumbar spine for further evaluation.  A urinalysis is pending from today.

## 2022-01-13 ENCOUNTER — Inpatient Hospital Stay: Payer: Medicare Other

## 2022-01-13 ENCOUNTER — Other Ambulatory Visit: Payer: Self-pay

## 2022-01-13 VITALS — BP 159/85 | HR 89 | Temp 98.6°F | Resp 18 | Ht 67.0 in | Wt 128.0 lb

## 2022-01-13 DIAGNOSIS — R1031 Right lower quadrant pain: Secondary | ICD-10-CM | POA: Diagnosis not present

## 2022-01-13 DIAGNOSIS — Z5112 Encounter for antineoplastic immunotherapy: Secondary | ICD-10-CM | POA: Diagnosis present

## 2022-01-13 DIAGNOSIS — R109 Unspecified abdominal pain: Secondary | ICD-10-CM | POA: Diagnosis not present

## 2022-01-13 DIAGNOSIS — M545 Low back pain, unspecified: Secondary | ICD-10-CM | POA: Diagnosis not present

## 2022-01-13 DIAGNOSIS — C787 Secondary malignant neoplasm of liver and intrahepatic bile duct: Secondary | ICD-10-CM | POA: Diagnosis not present

## 2022-01-13 DIAGNOSIS — Z79899 Other long term (current) drug therapy: Secondary | ICD-10-CM | POA: Diagnosis not present

## 2022-01-13 DIAGNOSIS — K5900 Constipation, unspecified: Secondary | ICD-10-CM | POA: Diagnosis not present

## 2022-01-13 DIAGNOSIS — Z800 Family history of malignant neoplasm of digestive organs: Secondary | ICD-10-CM | POA: Diagnosis not present

## 2022-01-13 DIAGNOSIS — C61 Malignant neoplasm of prostate: Secondary | ICD-10-CM | POA: Diagnosis present

## 2022-01-13 DIAGNOSIS — R32 Unspecified urinary incontinence: Secondary | ICD-10-CM | POA: Diagnosis not present

## 2022-01-13 DIAGNOSIS — Z809 Family history of malignant neoplasm, unspecified: Secondary | ICD-10-CM | POA: Diagnosis not present

## 2022-01-13 DIAGNOSIS — Z5189 Encounter for other specified aftercare: Secondary | ICD-10-CM | POA: Diagnosis not present

## 2022-01-13 MED ORDER — HEPARIN SOD (PORK) LOCK FLUSH 100 UNIT/ML IV SOLN
500.0000 [IU] | Freq: Once | INTRAVENOUS | Status: AC | PRN
  Administered 2022-01-13: 500 [IU]

## 2022-01-13 MED ORDER — SODIUM CHLORIDE 0.9 % IV SOLN
75.0000 mg/m2 | Freq: Once | INTRAVENOUS | Status: AC
  Administered 2022-01-13: 130 mg via INTRAVENOUS
  Filled 2022-01-13 (×2): qty 13

## 2022-01-13 MED ORDER — SODIUM CHLORIDE 0.9% FLUSH
10.0000 mL | INTRAVENOUS | Status: DC | PRN
  Administered 2022-01-13: 10 mL

## 2022-01-13 MED ORDER — SODIUM CHLORIDE 0.9 % IV SOLN: Freq: Once | INTRAVENOUS | Status: AC

## 2022-01-13 MED ORDER — SODIUM CHLORIDE 0.9 % IV SOLN
10.0000 mg | Freq: Once | INTRAVENOUS | Status: AC
  Administered 2022-01-13: 10 mg via INTRAVENOUS
  Filled 2022-01-13 (×2): qty 1

## 2022-01-13 NOTE — Patient Instructions (Signed)
[facility]  Discharge Instructions: Thank you for choosing [facility] to provide your oncology and hematology care.  If you have a lab appointment with the [facility], please go directly to the [facility] and check in at the registration area.   Wear comfortable clothing and clothing appropriate for easy access to any Portacath or PICC line.   We strive to give you quality time with your provider. You may need to reschedule your appointment if you arrive late (15 or more minutes).  Arriving late affects you and other patients whose appointments are after yours.  Also, if you miss three or more appointments without notifying the office, you may be dismissed from the clinic at the providers discretion.      For prescription refill requests, have your pharmacy contact our office and allow 72 hours for refills to be completed.    Today you received the following chemotherapy and/or immunotherapy agents:DocetaxelDocetaxel injection What is this medication? DOCETAXEL (doe se TAX el) is a chemotherapy drug. It targets fast dividing cells, like cancer cells, and causes these cells to die. This medicine is used to treat many types of cancers like breast cancer, certain stomach cancers, head and neck cancer, lung cancer, and prostate cancer. This medicine may be used for other purposes; ask your health care provider or pharmacist if you have questions. COMMON BRAND NAME(S): Docefrez, Taxotere What should I tell my care team before I take this medication? They need to know if you have any of these conditions: infection (especially a virus infection such as chickenpox, cold sores, or herpes) liver disease low blood counts, like low white cell, platelet, or red cell counts an unusual or allergic reaction to docetaxel, polysorbate 80, other chemotherapy agents, other medicines, foods, dyes, or preservatives pregnant or trying to get  pregnant breast-feeding How should I use this medication? This drug is given as an infusion into a vein. It is administered in a hospital or clinic by a specially trained health care professional. Talk to your pediatrician regarding the use of this medicine in children. Special care may be needed. Overdosage: If you think you have taken too much of this medicine contact a poison control center or emergency room at once. NOTE: This medicine is only for you. Do not share this medicine with others. What if I miss a dose? It is important not to miss your dose. Call your doctor or health care professional if you are unable to keep an appointment. What may interact with this medication? Do not take this medicine with any of the following medications: live virus vaccines This medicine may also interact with the following medications: aprepitant certain antibiotics like erythromycin or clarithromycin certain antivirals for HIV or hepatitis certain medicines for fungal infections like fluconazole, itraconazole, ketoconazole, posaconazole, or voriconazole cimetidine ciprofloxacin conivaptan cyclosporine dronedarone fluvoxamine grapefruit juice imatinib verapamil This list may not describe all possible interactions. Give your health care provider a list of all the medicines, herbs, non-prescription drugs, or dietary supplements you use. Also tell them if you smoke, drink alcohol, or use illegal drugs. Some items may interact with your medicine. What should I watch for while using this medication? Your condition will be monitored carefully while you are receiving this medicine. You will need important blood work done while you are taking this medicine. Call your doctor or health care professional for advice if you get a fever, chills or sore throat, or other symptoms of a cold or  flu. Do not treat yourself. This drug decreases your body's ability to fight infections. Try to avoid being around people  who are sick. Some products may contain alcohol. Ask your health care professional if this medicine contains alcohol. Be sure to tell all health care professionals you are taking this medicine. Certain medicines, like metronidazole and disulfiram, can cause an unpleasant reaction when taken with alcohol. The reaction includes flushing, headache, nausea, vomiting, sweating, and increased thirst. The reaction can last from 30 minutes to several hours. You may get drowsy or dizzy. Do not drive, use machinery, or do anything that needs mental alertness until you know how this medicine affects you. Do not stand or sit up quickly, especially if you are an older patient. This reduces the risk of dizzy or fainting spells. Alcohol may interfere with the effect of this medicine. Talk to your health care professional about your risk of cancer. You may be more at risk for certain types of cancer if you take this medicine. Do not become pregnant while taking this medicine or for 6 months after stopping it. Women should inform their doctor if they wish to become pregnant or think they might be pregnant. There is a potential for serious side effects to an unborn child. Talk to your health care professional or pharmacist for more information. Do not breast-feed an infant while taking this medicine or for 1 week after stopping it. Males who get this medicine must use a condom during sex with females who can get pregnant. If you get a woman pregnant, the baby could have birth defects. The baby could die before they are born. You will need to continue wearing a condom for 3 months after stopping the medicine. Tell your health care provider right away if your partner becomes pregnant while you are taking this medicine. This may interfere with the ability to father a child. You should talk to your doctor or health care professional if you are concerned about your fertility. What side effects may I notice from receiving this  medication? Side effects that you should report to your doctor or health care professional as soon as possible: allergic reactions like skin rash, itching or hives, swelling of the face, lips, or tongue blurred vision breathing problems changes in vision low blood counts - This drug may decrease the number of white blood cells, red blood cells and platelets. You may be at increased risk for infections and bleeding. nausea and vomiting pain, redness or irritation at site where injected pain, tingling, numbness in the hands or feet redness, blistering, peeling, or loosening of the skin, including inside the mouth signs of decreased platelets or bleeding - bruising, pinpoint red spots on the skin, black, tarry stools, nosebleeds signs of decreased red blood cells - unusually weak or tired, fainting spells, lightheadedness signs of infection - fever or chills, cough, sore throat, pain or difficulty passing urine swelling of the ankle, feet, hands Side effects that usually do not require medical attention (report to your doctor or health care professional if they continue or are bothersome): constipation diarrhea fingernail or toenail changes hair loss loss of appetite mouth sores muscle pain This list may not describe all possible side effects. Call your doctor for medical advice about side effects. You may report side effects to FDA at 1-800-FDA-1088. Where should I keep my medication? This drug is given in a hospital or clinic and will not be stored at home. NOTE: This sheet is a summary. It may not  cover all possible information. If you have questions about this medicine, talk to your doctor, pharmacist, or health care provider.  2022 [employer]vier/Gold Standard (2021-08-13 00:00:00)    To help prevent nausea and vomiting after your treatment, we encourage you to take your nausea medication as directed.  BELOW ARE SYMPTOMS THAT SHOULD BE REPORTED IMMEDIATELY: *FEVER GREATER THAN 100.4 F  (38 C) OR HIGHER *CHILLS OR SWEATING *NAUSEA AND VOMITING THAT IS NOT CONTROLLED WITH YOUR NAUSEA MEDICATION *UNUSUAL SHORTNESS OF BREATH *UNUSUAL BRUISING OR BLEEDING *URINARY PROBLEMS (pain or burning when urinating, or frequent urination) *BOWEL PROBLEMS (unusual diarrhea, constipation, pain near the anus) TENDERNESS IN MOUTH AND THROAT WITH OR WITHOUT PRESENCE OF ULCERS (sore throat, sores in mouth, or a toothache) UNUSUAL RASH, SWELLING OR PAIN  UNUSUAL VAGINAL DISCHARGE OR ITCHING   Items with * indicate a potential emergency and should be followed up as soon as possible or go to the Emergency Department if any problems should occur.  Please show the CHEMOTHERAPY ALERT CARD or IMMUNOTHERAPY ALERT CARD at check-in to the Emergency Department and triage nurse.  Should you have questions after your visit or need to cancel or reschedule your appointment, please contact [facility]  Dept: [phone]  and follow the prompts.  Office hours are 8:00 a.m. to 4:30 p.m. Monday - Friday. Please note that voicemails left after 4:00 p.m. may not be returned until the following business day.  We are closed weekends and major holidays. You have access to a nurse at all times for urgent questions. Please call the main number to the clinic Dept: [phone] and follow the prompts.  For any non-urgent questions, you may also contact your provider using MyChart. We now offer e-Visits for anyone 97 and older to request care online for non-urgent symptoms. For details visit [email or web address].   Also download the MyChart app! Go to the app store, search "MyChart", open the app, select [facility], and log in with your MyChart username and password.  Due to Covid, a mask is required upon entering the hospital/clinic. If you do not have a mask, one will be given to you upon arrival. For doctor visits, patients may have 1 support person aged 65 or older with them. For treatment  visits, patients cannot have anyone with them due to current Covid guidelines and our immunocompromised population.

## 2022-01-13 NOTE — Progress Notes (Signed)
Patient complains of abd pain that "came and went" quickly- Docetaxel stopped- [facility] notified. Patient denies urge to have bowel movement- States that he has been having occasional abdominal pain at home. Infusion re-started and patient encouraged to report any pain, shortness of breath or other unusual feelings. Patient states understanding

## 2022-01-14 ENCOUNTER — Telehealth: Payer: Self-pay | Admitting: Hematology and Oncology

## 2022-01-14 ENCOUNTER — Telehealth: Payer: Self-pay

## 2022-01-14 NOTE — Telephone Encounter (Signed)
01/14/22 left msg-MRI appt on 01/17/22 arrive at 330pm.

## 2022-01-14 NOTE — Telephone Encounter (Addendum)
Pt came in to see why I called yesterday. I told him I was calling to see how he tolerated the Taxotere infusion. Pt states, "Oh, I'm doing good so far". Pt denies N/V, rashes, diarrhea, SOB, and fever. Pt reminded to call us if develops a temp of 100.4 or higher, day, night or on weekends. He verbalized understanding.

## 2022-01-15 ENCOUNTER — Other Ambulatory Visit: Payer: Self-pay

## 2022-01-15 ENCOUNTER — Encounter: Payer: Self-pay | Admitting: Oncology

## 2022-01-15 ENCOUNTER — Inpatient Hospital Stay: Payer: Medicare Other

## 2022-01-15 VITALS — BP 134/65 | HR 90 | Temp 98.1°F | Resp 18 | Ht 67.0 in | Wt 128.1 lb

## 2022-01-15 DIAGNOSIS — C61 Malignant neoplasm of prostate: Secondary | ICD-10-CM

## 2022-01-15 DIAGNOSIS — Z5112 Encounter for antineoplastic immunotherapy: Secondary | ICD-10-CM | POA: Diagnosis not present

## 2022-01-15 MED ORDER — PEGFILGRASTIM-BMEZ 6 MG/0.6ML ~~LOC~~ SOSY
6.0000 mg | PREFILLED_SYRINGE | Freq: Once | SUBCUTANEOUS | Status: AC
  Administered 2022-01-15: 6 mg via SUBCUTANEOUS
  Filled 2022-01-15: qty 0.6

## 2022-01-17 ENCOUNTER — Encounter: Payer: Self-pay | Admitting: Oncology

## 2022-01-21 ENCOUNTER — Ambulatory Visit: Payer: Medicare Other | Admitting: Oncology

## 2022-01-21 ENCOUNTER — Other Ambulatory Visit: Payer: Medicare Other

## 2022-01-22 ENCOUNTER — Ambulatory Visit: Payer: Medicare Other

## 2022-01-24 ENCOUNTER — Ambulatory Visit: Payer: Medicare Other

## 2022-01-27 NOTE — Progress Notes (Incomplete)
[name]  [address] [name],  [name]  [address] [phone]  Clinic Day:  01/27/2022  Referring physician: No ref. provider found  This document serves as a record of services personally performed by [name], MD. It was created on their behalf by [name],[name], a trained medical scribe. The creation of this record is based on the scribe's personal observations and the provider's statements to them.  ASSESSMENT & PLAN:   Assessment & Plan: 1.  Stage III prostate cancer diagnosed in April 2019 treated with radical prostatectomy and adjuvant radiation therapy.  He is now on leuprolide injections every 6 months through Dr. Emeterio Reeve, with the last one given 10/24/21.  He is due for the next one 04/16/22.   2.  Liver metastases, biopsy proven to be metastatic from his prostate cancer. He received 12 cycles of docetaxel/prednisone, and his PSA continued to decrease. CT imaging in March revealed stable liver lesions. However, he did not tolerate, and was not complaint with bicalutamide and his PSA continued to increase to 32.8 in August 2022. At that time the patient was referred to [facility] for a 2nd opinion and has continued treatment there. PSMA PET scan in November showed only activity in 2 liver lesions He restarted ADT on November 23rd and started enzalutimide on November 25th, but did not tolerate it well and the PSA rose to 228 by late December. To bridge patient to PSMA treatment with Pluvicto, re-treatment with docetaxel has been recommended which will be carried out here.   He will proceed with a 2nd cycle of docetaxel next week. I will see him back in 3 weeks with a CBC, comprehensive panel and PSA for repeat clinical assessment prior to a 3rd cycle of docetaxel/prednisone. The patient understands the plans discussed today and is in agreement with them.  He knows to contact our office if he develops concerns prior to his next  appointment.   I provided *** minutes of face-to-face time during this this encounter and > 50% was spent counseling as documented under my assessment and plan.    [facility] [address] [place] [address] Dept: [phone] Dept Fax: [phone]   No orders of the defined types were placed in this encounter.     CHIEF COMPLAINT:  CC: Abdominal pain and bladder pain  Current Treatment: Palliative docetaxel/prednisone  HISTORY OF PRESENT ILLNESS:  Jeffrey Lambert is a 66 y.o. 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  He had symptoms of urinary frequency and a steadily rising PSA.  Biopsy confirmed prostatic adenocarcinoma with a Gleason 8, 4+ 4, in 100% of the biopsy submitted.  He also had cystoscopy at that time, which was benign.  He has had hematuria and hematospermia in the past.  CT imaging in May revealed mild prostatic enlargement and a bone scan was negative for metastatic disease.  He underwent radical prostatectomy and bilateral pelvic lymphadenectomy in May 2019.  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  There was extraprostatic extension at bilateral apices and positive margins at the left posterior lateral wall.  There was also infiltration of the seminal vesicles.  The tumor volume was approximately 50% of the gland.  Bilateral lymph nodes were clear.  He had a weight loss of 80 lb in the perioperative period.  Due to  his aggressive prostate cancer and family history of malignancy, he underwent testing for hereditary cancer syndromes with the [employer] myRisk Hereditary Cancer Panel.  This did not reveal any clinically significant mutation or variants of uncertain significance.     He was recommended for adjuvant radiation therapy.  He received 2720 cGy for 15 fractions and then stopped in  August 2019.  In January 2020, he came in for 1 treatment and in May 2020 he went back on daily treatment and had the remaining 5220 cGy in 29 fractions for a total cumulative dose of 7940 cGy. PET scan in July 2020 revealed mild hypermetabolic activity with soft tissue thickening in the deep pelvis felt to be postsurgical in nature.  There was no evidence of nodal metastasis or distant metastasis.  CT abdomen and pelvis done in the emergency room August 2020 revealed two hypodense liver lesions, new compared to imaging from 2019, ranging from 16-25 mm, which were suspicious for liver metastases, but were PET negative the month prior.  He continued Lupron and bicalutamide was added in March. The PSA responded.    CT abdomen and pelvis revealed interval progression of liver metastases with the largest measuring 3 cm. Bone scan was negative for metastatic disease, but revealed degenerative changes. Ultrasound guided biopsy of the liver in June revealed metastatic carcinoma consistent with the patient's known primary prostatic cancer. He continued his hormonal therapy and Taxotere was added.  He did receive 12 doses, completed in February 2022, with good response and the PSA going down from 26 to as low as 0.38. CT abdomen and pelvis in December revealed mild improvement in the hepatic metastasis with the largest lesion now measuring 1.7 cm, previously 1.9 cm.  CT chest, abdomen and pelvis in March 2022 did reveal any evidence of locally recurrent mass or lymphadenopathy. The multiple low-attenuation metastatic lesions of the liver were not significantly changed compared to prior examination.    Unfortunately patient could not tolerate the side effects of bicalutamide including tiredness and fatigue and states that he was intermittently compliant with the medication after April 2022. Repeat PSA in June 2022 showed that his PSA had increased to 8.1. CT chest, abdomen and pelvis from August 2022 which showed interval  increase in size of hypodense lesion in central right hepatic lobe measuring 2.7 X 1.7 cm. Other similar to slightly decreased size of multiple hypodense hepatic lesions were also noted. There was no evidence of osseous metastatic disease. Patient also had a nuclear medicine bone scan on the same day which showed a solitary focus of increased radiotracer activity involving the left fifth costochondral junction which per radiologist favors healed rib fracture rather than solitary metastatic disease. PSA was up to 32.8. At that time, the patient was referred to [facility] for a 2nd opinion and has continued treatment there. PSMA PET scan in November showed only activity in 2 liver lesions. He restarted ADT on November 23rd and started enzalutamide on November 25th, but did not tolerate it well and the PSA rose to 228 by late December. To bridge patient to PSMA treatment, re-treatment with docetaxel has been recommended.  He continues Lupron every 6 months. He will be unable to receive the PSMA (Pluvicto) treatment through [facility] until April.  He is therefore set up to receive docetaxel/prednisone until that time.  He missed his chemotherapy appointment on January 25th, so is now scheduled for February 6th.  INTERVAL HISTORY:  Jeffrey Lambert is added to the schedule today as he is  here for labs and reports several issues.  Since his last visit, he has had increasing low back pain, lower, abdominal/bladder pain, and an episode of pain in his right groin into his right thigh. He states he has been less active than previous.  He is using Naprosyn and occasionally oxycodone as needed.  He states he feels constipated, but his stool is soft and difficult to evacuate.  He denies nausea, vomiting or diarrhea.  He states he is drinking V8 high-fiber.  He denies fatigue.  He denies fevers or chills. His appetite is good. His weight  was not taken today .   Jeffrey Lambert is here for routine follow up prior to a 2nd cycle of  docetaxel.   His  appetite is good, and he has gained/lost _ pounds since his last visit.  He denies fever, chills or other signs of infection.  He denies nausea, vomiting, bowel issues, or abdominal pain.  He denies sore throat, cough, dyspnea, or chest pain.  REVIEW OF SYSTEMS:  Review of Systems  Constitutional: Negative.  Negative for appetite change, chills, fatigue, fever and unexpected weight change.  HENT:  Negative.    Eyes: Negative.   Respiratory: Negative.  Negative for chest tightness, cough, hemoptysis, shortness of breath and wheezing.   Cardiovascular: Negative.  Negative for chest pain, leg swelling and palpitations.  Gastrointestinal: Negative.  Negative for abdominal distention, abdominal pain, blood in stool, constipation, diarrhea, nausea and vomiting.  Endocrine: Negative.   Genitourinary: Negative.  Negative for difficulty urinating, dysuria, frequency and hematuria.   Musculoskeletal: Negative.  Negative for arthralgias, back pain, flank pain, gait problem and myalgias.  Skin: Negative.   Neurological: Negative.  Negative for dizziness, extremity weakness, gait problem, headaches, light-headedness, numbness, seizures and speech difficulty.  Hematological: Negative.   Psychiatric/Behavioral: Negative.  Negative for depression and sleep disturbance. The patient is not nervous/anxious.   All other systems reviewed and are negative.   VITALS:  There were no vitals taken for this visit.  Wt Readings from Last 3 Encounters:  01/15/22 128 lb 1.9 oz (58.1 kg)  01/13/22 128 lb (58.1 kg)  12/24/21 130 lb 4.8 oz (59.1 kg)    There is no height or weight on file to calculate BMI.  Performance status (ECOG): 1 - Symptomatic but completely ambulatory  PHYSICAL EXAM:  Physical Exam Constitutional:      General: He is not in acute distress.    Appearance: Normal appearance. He is normal weight.  HENT:     Head: Normocephalic and atraumatic.  Eyes:     General: No  scleral icterus.    Extraocular Movements: Extraocular movements intact.     Conjunctiva/sclera: Conjunctivae normal.     Pupils: Pupils are equal, round, and reactive to light.  Cardiovascular:     Rate and Rhythm: Normal rate and regular rhythm.     Pulses: Normal pulses.     Heart sounds: Normal heart sounds. No murmur heard.   No friction rub. No gallop.  Pulmonary:     Effort: Pulmonary effort is normal. No respiratory distress.     Breath sounds: Normal breath sounds.  Abdominal:     General: Bowel sounds are normal. There is no distension.     Palpations: Abdomen is soft. There is no hepatomegaly, splenomegaly or mass.     Tenderness: There is no abdominal tenderness.  Musculoskeletal:        General: Normal range of motion.     Cervical back: Normal range of  motion and neck supple.     Right lower leg: No edema.     Left lower leg: No edema.  Lymphadenopathy:     Cervical: No cervical adenopathy.  Skin:    General: Skin is warm and dry.  Neurological:     General: No focal deficit present.     Mental Status: He is alert and oriented to person, place, and time. Mental status is at baseline.  Psychiatric:        Mood and Affect: Mood normal.        Behavior: Behavior normal.        Thought Content: Thought content normal.        Judgment: Judgment normal.    LABS:   CBC Latest Ref Rng & Units 01/10/2022 12/24/2021 07/31/2021  WBC - 7.5 6.5 4.8  Hemoglobin 13.5 - 17.5 13.8 13.8 13.4(A)  Hematocrit 41 - 53 40(A) 40(A) 39(A)  Platelets 150 - 399 281 277 210   CMP Latest Ref Rng & Units 01/10/2022 12/24/2021 07/31/2021  Glucose 70 - 99 mg/dL - - -  BUN 4 - 21 19 22(A) 13  Creatinine 0.6 - 1.3 0.8 0.9 0.9  Sodium 137 - 147 137 139 139  Potassium 3.4 - 5.3 3.5 4.2 4.2  Chloride 99 - 108 104 105 107  CO2 13 - 22 29(A) 28(A) 20  Calcium 8.7 - 10.7 8.7 8.7 8.8  Total Protein 6.5 - 8.1 g/dL - - -  Total Bilirubin 0.3 - 1.2 mg/dL - - -  Alkaline Phos 25 - 125 71 62 69  AST  14 - 40 46(A) 40 49(A)  ALT 10 - 40 42(A) 32 55(A)      Lab Results  Component Value Date   PSA1 383.0 (H) 12/24/2021     STUDIES:  No results found.    HISTORY:   Allergies:  Allergies  Allergen Reactions   Grass Pollen([id number]-[id number]-Swt Vern)     Other reaction(s): Cough (ALLERGY/intolerance)   Other Other (See Comments)    Grass Pollen  Other reaction(s): Other (See Comments) Sneezing, runny nose    Current Medications: Current Outpatient Medications  Medication Sig Dispense Refill   benzonatate (TESSALON) 100 MG capsule Take 1 capsule by mouth every 8 (eight) hours.     dexamethasone (DECADRON) 4 MG tablet Take 2 tablets (8 mg total) by mouth 2 (two) times daily. Start the day before Taxotere. Then daily after chemo for 2 days. 30 tablet 1   loratadine (CLARITIN) 10 MG tablet Take by mouth.     ondansetron (ZOFRAN) 4 MG tablet Take 4 mg by mouth every 8 (eight) hours as needed for nausea or vomiting.     ondansetron (ZOFRAN) 8 MG tablet Take 1 tablet (8 mg total) by mouth 2 (two) times daily as needed for refractory nausea / vomiting. 30 tablet 1   oxyCODONE (OXY IR/ROXICODONE) 5 MG immediate release tablet Take 1 tablet (5 mg total) by mouth every 4 (four) hours as needed for severe pain. 100 tablet 0   pantoprazole (PROTONIX) 40 MG tablet pantoprazole 40 mg tablet,delayed release  TAKE 1 TABLET BY MOUTH TWICE DAILY WITH BREAKFAST AND EVENING MEAL     predniSONE (DELTASONE) 5 MG tablet Take 5 mg by mouth daily with breakfast.     predniSONE (DELTASONE) 5 MG tablet Take 1 tablet (5 mg total) by mouth in the morning and at bedtime. 60 tablet 11   PROAIR HFA 108 (90 Base) MCG/ACT inhaler Inhale 2 puffs into  the lungs every 6 (six) hours as needed for shortness of breath. 1 each 5   prochlorperazine (COMPAZINE) 10 MG tablet Take 10 mg by mouth every 6 (six) hours as needed for nausea or vomiting.     prochlorperazine (COMPAZINE) 10 MG tablet Take 1 tablet (10 mg total) by  mouth every 6 (six) hours as needed (Nausea or vomiting). 30 tablet 1   temazepam (RESTORIL) 15 MG capsule Take 15 mg by mouth at bedtime as needed for sleep.     traZODone (DESYREL) 50 MG tablet      No current facility-administered medications for this visit.    I, Rita Ohara, am acting as scribe for Derwood Kaplan, MD  I have reviewed this report as typed by the medical scribe, and it is complete and accurate.

## 2022-01-30 ENCOUNTER — Encounter: Payer: Self-pay | Admitting: Hematology and Oncology

## 2022-01-30 ENCOUNTER — Inpatient Hospital Stay (INDEPENDENT_AMBULATORY_CARE_PROVIDER_SITE_OTHER): Payer: Medicare Other | Admitting: Hematology and Oncology

## 2022-01-30 ENCOUNTER — Inpatient Hospital Stay: Payer: Medicare Other

## 2022-01-30 ENCOUNTER — Other Ambulatory Visit: Payer: Self-pay

## 2022-01-30 DIAGNOSIS — Z5112 Encounter for antineoplastic immunotherapy: Secondary | ICD-10-CM | POA: Diagnosis not present

## 2022-01-30 DIAGNOSIS — C61 Malignant neoplasm of prostate: Secondary | ICD-10-CM

## 2022-01-30 DIAGNOSIS — C787 Secondary malignant neoplasm of liver and intrahepatic bile duct: Secondary | ICD-10-CM

## 2022-01-30 LAB — CBC AND DIFFERENTIAL
HCT: 37 — AB (ref 41–53)
Hemoglobin: 12.7 — AB (ref 13.5–17.5)
Neutrophils Absolute: 4.7
Platelets: 323 (ref 150–399)
WBC: 6.1

## 2022-01-30 LAB — BASIC METABOLIC PANEL
BUN: 15 (ref 4–21)
CO2: 24 — AB (ref 13–22)
Chloride: 107 (ref 99–108)
Creatinine: 0.7 (ref 0.6–1.3)
Glucose: 106
Potassium: 4.2 (ref 3.4–5.3)
Sodium: 138 (ref 137–147)

## 2022-01-30 LAB — HEPATIC FUNCTION PANEL
ALT: 40 (ref 10–40)
AST: 42 — AB (ref 14–40)
Alkaline Phosphatase: 65 (ref 25–125)
Bilirubin, Total: 0.3

## 2022-01-30 LAB — CBC: RBC: 3.76 — AB (ref 3.87–5.11)

## 2022-01-30 LAB — COMPREHENSIVE METABOLIC PANEL
Albumin: 4.1 (ref 3.5–5.0)
Calcium: 8.3 — AB (ref 8.7–10.7)

## 2022-01-30 NOTE — Progress Notes (Signed)
Called to speak with patient regarding transportation, reached his voicemail, left him a message asking him to return my call.

## 2022-01-30 NOTE — Assessment & Plan Note (Signed)
Liver metastases, biopsy proven to be metastatic from his prostate cancer. He received 12 cycles ofdocetaxel/prednisone, and his PSA continued to decrease. CT imaging in March revealed stable liver lesions.However, he did not tolerate, and was not complaint with bicalutamide and his PSA continued to increase to 32.8 in August 2022. At that time the patient was referred to [facility] for a 2nd opinion and has continued treatment there. PSMA PET scan in November showed only activity in 2 liver lesions He restarted ADT on November 23rd and started enzalutimide on November 25th, but did not tolerate it well and the PSA rose to 228 by late December. To bridge patient to PSMA treatment with Pluvicto, re-treatment with docetaxel has been recommended which will be carried out here. He is due for cycle 2 on 02/27. He will return to clinic in 3 weeks for repeat evaluation.

## 2022-01-30 NOTE — Assessment & Plan Note (Signed)
Stage III prostate cancer diagnosed in April 2019 treated with radical prostatectomy and adjuvant radiation therapy. He is now on leuprolide injections every 6 months through Dr. Emeterio Reeve, with the last one given 10/24/21.  He is due for the next one 04/16/22.

## 2022-01-30 NOTE — Progress Notes (Cosign Needed Addendum)
Patient Care Team: Derwood Kaplan, MD as PCP - Hematology/Oncology (Oncology)  Clinic Day:  01/30/2022  Referring physician: Reita May, NP  ASSESSMENT & PLAN:   Assessment & Plan: Malignant neoplasm of prostate [facility]) Stage III prostate cancer diagnosed in April 2019 treated with radical prostatectomy and adjuvant radiation therapy.  He is now on leuprolide injections every 6 months through Dr. Emeterio Reeve, with the last one given 10/24/21.  He is due for the next one 04/16/22.  Liver metastasis (HCC) Liver metastases, biopsy proven to be metastatic from his prostate cancer. He received 12 cycles of docetaxel/prednisone, and his PSA continued to decrease. CT imaging in March revealed stable liver lesions. However, he did not tolerate, and was not complaint with bicalutamide and his PSA continued to increase to 32.8 in August 2022. At that time the patient was referred to [facility] for a 2nd opinion and has continued treatment there. PSMA PET scan in November showed only activity in 2 liver lesions He restarted ADT on November 23rd and started enzalutimide on November 25th, but did not tolerate it well and the PSA rose to 228 by late December. To bridge patient to PSMA treatment with Pluvicto, re-treatment with docetaxel has been recommended which will be carried out here. He is due for cycle 2 on 02/27. He will return to clinic in 3 weeks for repeat evaluation.   Back pain, lumbosacral He has been having lower back pain and now notes chest (breast) bone pain. This is most likely related to neulasta injections. He continues with daily loratadine and states his oxycodone relieves his pain.    The patient understands the plans discussed today and is in agreement with them.  He knows to contact our office if he develops concerns prior to his next appointment.   Melodye Ped, NP  [place] [address] Dept: [phone] Dept Fax: [phone]   No orders of the defined types were placed in this encounter.     CHIEF COMPLAINT:  CC: A 66 year old male with history of metastatic prostate cancer here for 3 week evaluation.  Current Treatment:  Docetaxel, Prednisone  INTERVAL HISTORY:  Jeffrey Lambert is here today for repeat clinical assessment. He denies fevers or chills. He denies pain. His appetite is good. His weight has increased 4 pounds over last 3 weeks .  I have reviewed the past medical history, past surgical history, social history and family history with the patient and they are unchanged from previous note.  ALLERGIES:  is allergic to grass pollen(k-o-r-t-swt vern) and other.  MEDICATIONS:  Current Outpatient Medications  Medication Sig Dispense Refill   dexamethasone (DECADRON) 4 MG tablet Take 2 tablets (8 mg total) by mouth 2 (two) times daily. Start the day before Taxotere. Then daily after chemo for 2 days. 30 tablet 1   loratadine (CLARITIN) 10 MG tablet Take by mouth.     ondansetron (ZOFRAN) 4 MG tablet Take 4 mg by mouth every 8 (eight) hours as needed for nausea or vomiting.     ondansetron (ZOFRAN) 8 MG tablet Take 1 tablet (8 mg total) by mouth 2 (two) times daily as needed for refractory nausea / vomiting. 30 tablet 1   oxyCODONE (OXY IR/ROXICODONE) 5 MG immediate release tablet Take 1 tablet (5 mg total) by mouth every 4 (four) hours as needed for severe pain. 100 tablet 0   pantoprazole (PROTONIX) 40 MG tablet pantoprazole 40  mg tablet,delayed release  TAKE 1 TABLET BY MOUTH TWICE DAILY WITH BREAKFAST AND EVENING MEAL     predniSONE (DELTASONE) 5 MG tablet Take 1 tablet (5 mg total) by mouth in the morning and at bedtime. 60 tablet 11   PROAIR HFA 108 (90 Base) MCG/ACT inhaler Inhale 2 puffs into the lungs every 6 (six) hours as needed for shortness of breath. 1 each 5   prochlorperazine (COMPAZINE) 10 MG tablet Take 10 mg by mouth every 6  (six) hours as needed for nausea or vomiting.     prochlorperazine (COMPAZINE) 10 MG tablet Take 1 tablet (10 mg total) by mouth every 6 (six) hours as needed (Nausea or vomiting). 30 tablet 1   traZODone (DESYREL) 50 MG tablet      No current facility-administered medications for this visit.    HISTORY OF PRESENT ILLNESS:   Oncology History  Malignant neoplasm of prostate ([place])  03/16/2018 Cancer Staging   Staging form: Prostate, AJCC 8th Edition - Clinical stage from 03/16/2018: Stage IIIB (cT3b, cN0, cM0, Grade Group: 4) - Signed by Derwood Kaplan, MD on 11/12/2020 Staging comments: 100% of bx, Radical prostatectomy    03/23/2018 Cancer Staging   Staging form: Prostate, AJCC 8th Edition - Pathologic stage from 03/23/2018: Stage IIIC (pT3b, pN0, cM0, Grade Group: 5) - Signed by Derwood Kaplan, MD on 11/12/2020 Prognostic indicators: Extraprostatic extension, LVI, pos.margins, infiltration of seminal vesicles Staging comments: Rec. Adjuvant radiation, stopped 1/2 way, later completed Offered clinical trial, declined   05/30/2020 - 01/25/2021 Chemotherapy          08/26/2020 Initial Diagnosis   Malignant neoplasm of prostate ([place])   01/13/2022 -  Chemotherapy   Patient is on Treatment Plan : PROSTATE Docetaxel + Prednisone q21d         REVIEW OF SYSTEMS:   Constitutional: Denies fevers, chills or abnormal weight loss Eyes: Denies blurriness of vision Ears, nose, mouth, throat, and face: Denies mucositis or sore throat Respiratory: Denies cough, dyspnea or wheezes Cardiovascular: Denies palpitation, chest discomfort or lower extremity swelling Gastrointestinal:  Denies nausea, heartburn or change in bowel habits Skin: Denies abnormal skin rashes Lymphatics: Denies new lymphadenopathy or easy bruising Neurological:Denies numbness, tingling or new weaknesses Behavioral/Psych: Mood is stable, no new changes  All other systems were reviewed with the patient and are  negative.   VITALS:  Blood pressure 122/74, pulse 89, temperature 98.3 F (36.8 C), temperature source Oral, resp. rate 20, height 5\' 6"  (1.676 m), weight 132 lb 8 oz (60.1 kg), SpO2 97 %.  Wt Readings from Last 3 Encounters:  01/30/22 132 lb 8 oz (60.1 kg)  01/15/22 128 lb 1.9 oz (58.1 kg)  01/13/22 128 lb (58.1 kg)    Body mass index is 21.39 kg/m.  Performance status (ECOG): 1 - Symptomatic but completely ambulatory  PHYSICAL EXAM:   GENERAL:alert, no distress and comfortable SKIN: skin color, texture, turgor are normal, no rashes or significant lesions EYES: normal, Conjunctiva are pink and non-injected, sclera clear OROPHARYNX:no exudate, no erythema and lips, buccal mucosa, and tongue normal  NECK: supple, thyroid normal size, non-tender, without nodularity LYMPH:  no palpable lymphadenopathy in the cervical, axillary or inguinal LUNGS: clear to auscultation and percussion with normal breathing effort HEART: regular rate & rhythm and no murmurs and no lower extremity edema ABDOMEN:abdomen soft, non-tender and normal bowel sounds Musculoskeletal:no cyanosis of digits and no clubbing  NEURO: alert & oriented x 3 with fluent speech, no focal motor/sensory deficits  LABORATORY DATA:  I have reviewed the data as listed    Component Value Date/Time   NA 138 01/30/2022 0000   K 4.2 01/30/2022 0000   CL 107 01/30/2022 0000   CO2 24 (A) 01/30/2022 0000   GLUCOSE 100 (H) 10/22/2020 0930   BUN 15 01/30/2022 0000   CREATININE 0.7 01/30/2022 0000   CREATININE 0.92 10/22/2020 0930   CALCIUM 8.3 (A) 01/30/2022 0000   PROT 7.4 10/22/2020 0930   ALBUMIN 4.1 01/30/2022 0000   AST 42 (A) 01/30/2022 0000   AST 21 10/22/2020 0930   ALT 40 01/30/2022 0000   ALT 23 10/22/2020 0930   ALKPHOS 65 01/30/2022 0000   BILITOT 0.5 10/22/2020 0930   GFRNONAA >60 10/22/2020 0930    No results found for: SPEP, UPEP  Lab Results  Component Value Date   WBC 6.1 01/30/2022   NEUTROABS  4.70 01/30/2022   HGB 12.7 (A) 01/30/2022   HCT 37 (A) 01/30/2022   MCV 97 (A) 01/10/2022   PLT 323 01/30/2022      Chemistry      Component Value Date/Time   NA 138 01/30/2022 0000   K 4.2 01/30/2022 0000   CL 107 01/30/2022 0000   CO2 24 (A) 01/30/2022 0000   BUN 15 01/30/2022 0000   CREATININE 0.7 01/30/2022 0000   CREATININE 0.92 10/22/2020 0930   GLU 106 01/30/2022 0000      Component Value Date/Time   CALCIUM 8.3 (A) 01/30/2022 0000   ALKPHOS 65 01/30/2022 0000   AST 42 (A) 01/30/2022 0000   AST 21 10/22/2020 0930   ALT 40 01/30/2022 0000   ALT 23 10/22/2020 0930   BILITOT 0.5 10/22/2020 0930       RADIOGRAPHIC STUDIES: I have personally reviewed the radiological images as listed and agreed with the findings in the report. No results found.

## 2022-01-30 NOTE — Assessment & Plan Note (Signed)
He has been having lower back pain and now notes chest (breast) bone pain. This is most likely related to neulasta injections. He continues with daily loratadine and states his oxycodone relieves his pain.

## 2022-01-31 ENCOUNTER — Ambulatory Visit: Payer: Medicare Other | Admitting: Oncology

## 2022-01-31 ENCOUNTER — Other Ambulatory Visit: Payer: Medicare Other

## 2022-01-31 LAB — PROSTATE-SPECIFIC AG, SERUM (LABCORP): Prostate Specific Ag, Serum: 532 ng/mL — ABNORMAL HIGH (ref 0.0–4.0)

## 2022-01-31 MED FILL — Docetaxel Soln for IV Infusion 160 MG/16ML: INTRAVENOUS | Qty: 13 | Status: AC

## 2022-01-31 MED FILL — Dexamethasone Sodium Phosphate Inj 100 MG/10ML: INTRAMUSCULAR | Qty: 1 | Status: AC

## 2022-02-03 ENCOUNTER — Inpatient Hospital Stay: Payer: Medicare Other

## 2022-02-03 ENCOUNTER — Encounter: Payer: Self-pay | Admitting: Oncology

## 2022-02-03 ENCOUNTER — Other Ambulatory Visit: Payer: Self-pay

## 2022-02-03 VITALS — BP 145/84 | HR 76 | Temp 98.6°F | Resp 18 | Ht 66.0 in | Wt 133.2 lb

## 2022-02-03 DIAGNOSIS — C61 Malignant neoplasm of prostate: Secondary | ICD-10-CM

## 2022-02-03 DIAGNOSIS — Z5112 Encounter for antineoplastic immunotherapy: Secondary | ICD-10-CM | POA: Diagnosis not present

## 2022-02-03 MED ORDER — SODIUM CHLORIDE 0.9 % IV SOLN
75.0000 mg/m2 | Freq: Once | INTRAVENOUS | Status: AC
  Administered 2022-02-03: 130 mg via INTRAVENOUS
  Filled 2022-02-03: qty 13

## 2022-02-03 MED ORDER — SODIUM CHLORIDE 0.9 % IV SOLN
10.0000 mg | Freq: Once | INTRAVENOUS | Status: AC
  Administered 2022-02-03: 10 mg via INTRAVENOUS
  Filled 2022-02-03: qty 10

## 2022-02-03 MED ORDER — SODIUM CHLORIDE 0.9% FLUSH
10.0000 mL | INTRAVENOUS | Status: DC | PRN
  Administered 2022-02-03: 10 mL

## 2022-02-03 MED ORDER — SODIUM CHLORIDE 0.9 % IV SOLN: Freq: Once | INTRAVENOUS | Status: AC

## 2022-02-03 MED ORDER — HEPARIN SOD (PORK) LOCK FLUSH 100 UNIT/ML IV SOLN
500.0000 [IU] | Freq: Once | INTRAVENOUS | Status: AC | PRN
  Administered 2022-02-03: 500 [IU]

## 2022-02-03 NOTE — Patient Instructions (Signed)
Docetaxel injection What is this medication? DOCETAXEL (doe se TAX el) is a chemotherapy drug. It targets fast dividing cells, like cancer cells, and causes these cells to die. This medicine is used to treat many types of cancers like breast cancer, certain stomach cancers, head and neck cancer, lung cancer, and prostate cancer. This medicine may be used for other purposes; ask your health care provider or pharmacist if you have questions. COMMON BRAND NAME(S): Docefrez, Taxotere What should I tell my care team before I take this medication? They need to know if you have any of these conditions: infection (especially a virus infection such as chickenpox, cold sores, or herpes) liver disease low blood counts, like low white cell, platelet, or red cell counts an unusual or allergic reaction to docetaxel, polysorbate 80, other chemotherapy agents, other medicines, foods, dyes, or preservatives pregnant or trying to get pregnant breast-feeding How should I use this medication? This drug is given as an infusion into a vein. It is administered in a hospital or clinic by a specially trained health care professional. Talk to your pediatrician regarding the use of this medicine in children. Special care may be needed. Overdosage: If you think you have taken too much of this medicine contact a poison control center or emergency room at once. NOTE: This medicine is only for you. Do not share this medicine with others. What if I miss a dose? It is important not to miss your dose. Call your doctor or health care professional if you are unable to keep an appointment. What may interact with this medication? Do not take this medicine with any of the following medications: live virus vaccines This medicine may also interact with the following medications: aprepitant certain antibiotics like erythromycin or clarithromycin certain antivirals for HIV or hepatitis certain medicines for fungal infections like  fluconazole, itraconazole, ketoconazole, posaconazole, or voriconazole cimetidine ciprofloxacin conivaptan cyclosporine dronedarone fluvoxamine grapefruit juice imatinib verapamil This list may not describe all possible interactions. Give your health care provider a list of all the medicines, herbs, non-prescription drugs, or dietary supplements you use. Also tell them if you smoke, drink alcohol, or use illegal drugs. Some items may interact with your medicine. What should I watch for while using this medication? Your condition will be monitored carefully while you are receiving this medicine. You will need important blood work done while you are taking this medicine. Call your doctor or health care professional for advice if you get a fever, chills or sore throat, or other symptoms of a cold or flu. Do not treat yourself. This drug decreases your body's ability to fight infections. Try to avoid being around people who are sick. Some products may contain alcohol. Ask your health care professional if this medicine contains alcohol. Be sure to tell all health care professionals you are taking this medicine. Certain medicines, like metronidazole and disulfiram, can cause an unpleasant reaction when taken with alcohol. The reaction includes flushing, headache, nausea, vomiting, sweating, and increased thirst. The reaction can last from 30 minutes to several hours. You may get drowsy or dizzy. Do not drive, use machinery, or do anything that needs mental alertness until you know how this medicine affects you. Do not stand or sit up quickly, especially if you are an older patient. This reduces the risk of dizzy or fainting spells. Alcohol may interfere with the effect of this medicine. Talk to your health care professional about your risk of cancer. You may be more at risk for certain types  of cancer if you take this medicine. Do not become pregnant while taking this medicine or for 6 months after  stopping it. Women should inform their doctor if they wish to become pregnant or think they might be pregnant. There is a potential for serious side effects to an unborn child. Talk to your health care professional or pharmacist for more information. Do not breast-feed an infant while taking this medicine or for 1 week after stopping it. Males who get this medicine must use a condom during sex with females who can get pregnant. If you get a woman pregnant, the baby could have birth defects. The baby could die before they are born. You will need to continue wearing a condom for 3 months after stopping the medicine. Tell your health care provider right away if your partner becomes pregnant while you are taking this medicine. This may interfere with the ability to father a child. You should talk to your doctor or health care professional if you are concerned about your fertility. What side effects may I notice from receiving this medication? Side effects that you should report to your doctor or health care professional as soon as possible: allergic reactions like skin rash, itching or hives, swelling of the face, lips, or tongue blurred vision breathing problems changes in vision low blood counts - This drug may decrease the number of white blood cells, red blood cells and platelets. You may be at increased risk for infections and bleeding. nausea and vomiting pain, redness or irritation at site where injected pain, tingling, numbness in the hands or feet redness, blistering, peeling, or loosening of the skin, including inside the mouth signs of decreased platelets or bleeding - bruising, pinpoint red spots on the skin, black, tarry stools, nosebleeds signs of decreased red blood cells - unusually weak or tired, fainting spells, lightheadedness signs of infection - fever or chills, cough, sore throat, pain or difficulty passing urine swelling of the ankle, feet, hands Side effects that usually do not  require medical attention (report to your doctor or health care professional if they continue or are bothersome): constipation diarrhea fingernail or toenail changes hair loss loss of appetite mouth sores muscle pain This list may not describe all possible side effects. Call your doctor for medical advice about side effects. You may report side effects to FDA at 1-800-FDA-1088. Where should I keep my medication? This drug is given in a hospital or clinic and will not be stored at home. NOTE: This sheet is a summary. It may not cover all possible information. If you have questions about this medicine, talk to your doctor, pharmacist, or health care provider.  2022 Elsevier/Gold Standard (2021-08-13 00:00:00)

## 2022-02-05 ENCOUNTER — Other Ambulatory Visit: Payer: Self-pay

## 2022-02-05 ENCOUNTER — Inpatient Hospital Stay: Payer: Medicare Other | Attending: Oncology

## 2022-02-05 VITALS — BP 135/95 | HR 97 | Temp 98.2°F | Resp 18 | Wt 132.0 lb

## 2022-02-05 DIAGNOSIS — Z5112 Encounter for antineoplastic immunotherapy: Secondary | ICD-10-CM | POA: Diagnosis present

## 2022-02-05 DIAGNOSIS — C61 Malignant neoplasm of prostate: Secondary | ICD-10-CM | POA: Diagnosis not present

## 2022-02-05 DIAGNOSIS — C787 Secondary malignant neoplasm of liver and intrahepatic bile duct: Secondary | ICD-10-CM | POA: Insufficient documentation

## 2022-02-05 DIAGNOSIS — Z5189 Encounter for other specified aftercare: Secondary | ICD-10-CM | POA: Insufficient documentation

## 2022-02-05 MED ORDER — PEGFILGRASTIM-BMEZ 6 MG/0.6ML ~~LOC~~ SOSY
6.0000 mg | PREFILLED_SYRINGE | Freq: Once | SUBCUTANEOUS | Status: AC
  Administered 2022-02-05: 6 mg via SUBCUTANEOUS
  Filled 2022-02-05: qty 0.6

## 2022-02-05 NOTE — Patient Instructions (Signed)

## 2022-02-12 ENCOUNTER — Telehealth: Payer: Self-pay

## 2022-02-12 NOTE — Telephone Encounter (Addendum)
Jeffrey Lambert Phy,pharmacist: appts for taxotere have been cancelled ? ?Dr Hinton Rao: We will stop the Taxotere, I am concerned that it is not working.  But let's still get the CT scans and labs, & keep appt with me so we could discuss the results and the alternatives. PSA very high and liver lesion looks bigger on MRI spine ? ?Jeffrey Longton,RN: Pt  calls and states, "Dr Hinton Rao called me earlier this morning. She asked me how I did with last treatment. I didn't really want to tell her exactly how bad it was. I want to be honest with Dr Hinton Rao. I had a really hard time with last treatment. I don't know if I can take another one. I cried and cried after I got tha shot for my white cells.I just want to stop". Above message sent to Dr Hinton Rao. ?

## 2022-02-14 ENCOUNTER — Telehealth: Payer: Self-pay | Admitting: Oncology

## 2022-02-14 ENCOUNTER — Other Ambulatory Visit: Payer: Self-pay | Admitting: Oncology

## 2022-02-14 DIAGNOSIS — C61 Malignant neoplasm of prostate: Secondary | ICD-10-CM

## 2022-02-14 NOTE — Telephone Encounter (Signed)
02/14/22 LEFT MSG ABOUT  CT SCANS AND DV ?

## 2022-02-14 NOTE — Telephone Encounter (Signed)
02/14/22 spoke with patient and scheduled ct scans/dv. ?

## 2022-02-17 ENCOUNTER — Encounter: Payer: Self-pay | Admitting: Oncology

## 2022-02-18 LAB — CBC AND DIFFERENTIAL
HCT: 40 — AB (ref 41–53)
Hemoglobin: 13 — AB (ref 13.5–17.5)
Neutrophils Absolute: 5.9
Platelets: 217 10*3/uL (ref 150–400)
WBC: 8.2

## 2022-02-18 LAB — COMPREHENSIVE METABOLIC PANEL
Albumin: 4.8 (ref 3.5–5.0)
Calcium: 9.3 (ref 8.7–10.7)

## 2022-02-18 LAB — BASIC METABOLIC PANEL
BUN: 16 (ref 4–21)
CO2: 30 — AB (ref 13–22)
Chloride: 102 (ref 99–108)
Creatinine: 0.8 (ref 0.6–1.3)
Glucose: 92
Potassium: 4 mEq/L (ref 3.5–5.1)
Sodium: 139 (ref 137–147)

## 2022-02-18 LAB — HEPATIC FUNCTION PANEL
ALT: 43 U/L — AB (ref 10–40)
AST: 39 (ref 14–40)
Alkaline Phosphatase: 81 (ref 25–125)
Bilirubin, Total: 0.5

## 2022-02-18 LAB — CBC: RBC: 4.03 (ref 3.87–5.11)

## 2022-02-19 ENCOUNTER — Telehealth: Payer: Self-pay

## 2022-02-19 NOTE — Telephone Encounter (Signed)
CT report and lab results printed and gave to Dr. Hinton Rao ?

## 2022-02-19 NOTE — Telephone Encounter (Signed)
-----   Message from Derwood Kaplan, MD sent at 02/19/2022  1:46 PM EDT ----- ?Regarding: CT's ?CT scans this week ? ?

## 2022-02-20 ENCOUNTER — Encounter: Payer: Self-pay | Admitting: Oncology

## 2022-02-20 ENCOUNTER — Other Ambulatory Visit: Payer: Medicare Other

## 2022-02-20 ENCOUNTER — Other Ambulatory Visit: Payer: Self-pay | Admitting: Oncology

## 2022-02-20 ENCOUNTER — Inpatient Hospital Stay (INDEPENDENT_AMBULATORY_CARE_PROVIDER_SITE_OTHER): Payer: Medicare Other | Admitting: Oncology

## 2022-02-20 ENCOUNTER — Other Ambulatory Visit: Payer: Self-pay

## 2022-02-20 VITALS — BP 138/76 | HR 73 | Temp 97.9°F | Resp 16 | Ht 66.0 in | Wt 133.0 lb

## 2022-02-20 DIAGNOSIS — C61 Malignant neoplasm of prostate: Secondary | ICD-10-CM

## 2022-02-20 DIAGNOSIS — C787 Secondary malignant neoplasm of liver and intrahepatic bile duct: Secondary | ICD-10-CM | POA: Diagnosis not present

## 2022-02-20 NOTE — Progress Notes (Signed)
DISCONTINUE OFF PATHWAY REGIMEN - Prostate ? ? ?[id number]:Docetaxel 75 mg/m2 IV D1  + Prednisone 5 mg PO BID D1-21 q21 Days: ?  A cycle is every 21 days: ?    Prednisone  ?    Docetaxel  ? ?**Always confirm dose/schedule in your pharmacy ordering system** ? ?REASON: Disease Progression ?PRIOR TREATMENT: Off Pathway: Docetaxel 75 mg/m2 IV D1  + Prednisone 5 mg PO BID D1-21 q21 Days ?TREATMENT RESPONSE: Progressive Disease (PD) ? ?START ON PATHWAY REGIMEN - Prostate ? ? ?  A cycle is every 21 days.: ?    Cabazitaxel  ?    Prednisone  ? ?**Always confirm dose/schedule in your pharmacy ordering system** ? ?Patient Characteristics: ?Adenocarcinoma, Recurrent/New Systemic Disease (Including Biochemical Recurrence), Castration Resistant, M1, Prior Novel Hormonal Agent, No Molecular Alteration or Targeted Therapy Exhausted, Prior Docetaxel/Docetaxel Not Indicated ?Histology: Adenocarcinoma ?Therapeutic Status: Recurrent/New Systemic Disease (Including Biochemical Recurrence) ? ?Intent of Therapy: ?Non-Curative / Palliative Intent, Discussed with Patient ?

## 2022-02-21 ENCOUNTER — Encounter: Payer: Self-pay | Admitting: Oncology

## 2022-02-24 ENCOUNTER — Ambulatory Visit: Payer: Medicare Other

## 2022-02-25 ENCOUNTER — Telehealth: Payer: Self-pay

## 2022-02-25 ENCOUNTER — Other Ambulatory Visit: Payer: Self-pay | Admitting: Oncology

## 2022-02-25 ENCOUNTER — Inpatient Hospital Stay: Payer: Medicare Other | Admitting: Oncology

## 2022-02-25 ENCOUNTER — Inpatient Hospital Stay: Payer: Medicare Other | Admitting: Hematology and Oncology

## 2022-02-25 ENCOUNTER — Telehealth: Payer: Self-pay | Admitting: Oncology

## 2022-02-25 DIAGNOSIS — C61 Malignant neoplasm of prostate: Secondary | ICD-10-CM

## 2022-02-25 MED ORDER — PREDNISONE 10 MG PO TABS: 10.0000 mg | ORAL_TABLET | Freq: Two times a day (BID) | ORAL | 5 refills | Status: DC

## 2022-02-25 NOTE — Telephone Encounter (Signed)
All upcoming appts have been cancelled. LVM for pt to contact office to R/S Chemo Ed, infusions and MD appts. ? ?Appointment Scheduled ?(Newest Message First) ?[facility] Scheduling (supporting Mychart, Generic) 6 hours ago (3:15 AM)  ? ?DW ?Sorry I need to reschedule my appointments till next week . ?  ? ?[place], Generic  Jeffrey Lambert 21 hours ago (12:51 PM)  ? ?GM ?Appointment Information: ?    Visit Type: ESTABLISHED PATIENT 30 ?        Date: 03/17/2022 ?                Dept: [facility] ?                Provider: Derwood Kaplan ?                Time: 3:00 PM ?                Length: 30 min ?  ?Appt Status: Scheduled ?  ?

## 2022-02-25 NOTE — Telephone Encounter (Signed)
-----   Message from Derwood Kaplan, MD sent at 02/25/2022  8:49 AM EDT ----- ?Regarding: med ?He was supposed to do chemo ed with [facility] today, not sure what they are doing. ?Tell him he is to take prednisone as part of the treatment, 10 mg bid.  I sent the Rx in. ? ?

## 2022-02-26 ENCOUNTER — Ambulatory Visit: Payer: Medicare Other

## 2022-02-27 ENCOUNTER — Telehealth: Payer: Self-pay | Admitting: Oncology

## 2022-02-27 ENCOUNTER — Other Ambulatory Visit: Payer: Self-pay | Admitting: Pharmacist

## 2022-02-27 NOTE — Telephone Encounter (Signed)
02/27/22 Spoke with patient and rescheduled all appts ?

## 2022-03-02 NOTE — Progress Notes (Signed)
?[name]  ?[address] ?[place],  [place]  [address] ?[phone] ? ?Clinic Day:  02/20/22 ? ?Referring physician: Derwood Kaplan, Lambert ? ?ASSESSMENT & PLAN:  ? ?Liver metastasis ([place]) ?Liver metastases diagnosed in June 2021, biopsy proven to be metastatic from his prostate cancer. He had a good response to12 cycles of docetaxel/prednisone, but was not complaint with maintenance bicalutamide. PSA increased from March 2022 to August 2022 up to 38.  He was then referred to [facility] for a 2nd opinion. PSMA PET scan in November showed only activity in 2 liver lesions He restarted ADT on November 23rd and started enzalutimide on November 25th, but did not tolerate it well and the PSA rose to 228 by late December. To bridge patient to PSMA treatment with Pluvicto, re-treatment with docetaxel/prednisone was recommended and carried out here.  This was started on February 6th, he has had severe pain with the Neulasta.  However his PSA has continued to increase, from 30 to to 383 and now the most recent is 532.  We have stopped the docetaxel.  His CT scan clearly shows progression of his liver metastases with multiple new and enlarged hypodense lesions.  We therefore discussed switching to Jevtana.  I contacted Dr. Heide Guile and the Pluvicto will not be available for at least another month, so he does recommend proceeding with [facility] for now. ? ?Back pain, lumbosacral ?He has had chronic lower back pain attributed to degenerative disease.  There is no evidence of metastatic cancer on imaging in August or November.  However, the pain has worsened and he is having low abdominal/bladder pain with an episode of right groin pain into the right thigh.  An MRI of the lumbar spine reveals severe degenerative disc disease and osteoarthritis but not malignancy.  I have reviewed these results with him.   ?  ? ?I had planned start Jevtana next week.  He wishes to try it without the  Neulasta due to the severe pain that he gets from that.  He would need to take prednisone 10 mg twice daily with this.  However, I have now been notified that Dr. Emeterio Reeve at [facility] may be able to get him started on the new therapy through [employer].  I will therefore put this treatment plan on hold.  We will be available for local oncology care as needed ? ?The patient understands the plans discussed today and is in agreement with them.  He knows to contact our office if he develops concerns prior to his next appointment. ? ? ? ? ?Jeffrey Lambert  ?[facility] ?[place] ?[place] [address] ?Dept: [phone] ?Dept Fax: [phone]  ? ? ?CHIEF COMPLAINT:  ?CC: Abdominal pain and bladder pain ? ?Current Treatment: Palliative docetaxel/prednisone ? ?HISTORY OF PRESENT ILLNESS:  ?Jeffrey Lambert is a 66 y.o. 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  He had symptoms of urinary frequency and a steadily rising PSA.  Biopsy confirmed prostatic adenocarcinoma with a Gleason 8, 4+ 4, in 100% of the biopsy submitted.  He also had cystoscopy at that time, which was benign.  He has had hematuria and hematospermia in the past.  CT imaging in May revealed mild prostatic enlargement and a bone scan was negative for metastatic disease.  He underwent radical prostatectomy and bilateral pelvic lymphadenectomy in May 2019.  Pathology  revealed ductal carcinoma with a Gleason 9 (4+5) with lymphovascular invasion.  There was extraprostatic extension at bilateral apices and positive margins at the left posterior lateral wall.  There was also infiltration of the seminal vesicles.  The tumor volume was approximately 50% of the gland.  Bilateral lymph nodes were clear.  He had a weight loss of 80 lb in the perioperative period.  Due to his aggressive prostate cancer and family history of malignancy,  he underwent testing for hereditary cancer syndromes with the Myriad myRisk Hereditary Cancer Panel.  This did not reveal any clinically significant mutation or variants of uncertain significance.   ?  ?He was recommended for adjuvant radiation therapy.  He received 2720 cGy for 15 fractions and then stopped in August 2019.  In January 2020, he came in for 1 treatment and in May 2020 he went back on daily treatment and had the remaining 5220 cGy in 29 fractions for a total cumulative dose of 7940 cGy. PET scan in July 2020 revealed mild hypermetabolic activity with soft tissue thickening in the deep pelvis felt to be postsurgical in nature.  There was no evidence of nodal metastasis or distant metastasis.  CT abdomen and pelvis done in the emergency room August 2020 revealed two hypodense liver lesions, new compared to imaging from 2019, ranging from 16-25 mm, which were suspicious for liver metastases, but were PET negative the month prior.  He continued Lupron and bicalutamide was added in March. The PSA responded.  ?  ?CT abdomen and pelvis revealed interval progression of liver metastases with the largest measuring 3 cm. Bone scan was negative for metastatic disease, but revealed degenerative changes. Ultrasound guided biopsy of the liver in June revealed metastatic carcinoma consistent with the patient's known primary prostatic cancer. He continued his hormonal therapy and Taxotere was added.  He did receive 12 doses, completed in February 2022, with good response and the PSA going down from 26 to as low as 0.38. CT abdomen and pelvis in December revealed mild improvement in the hepatic metastasis with the largest lesion now measuring 1.7 cm, previously 1.9 cm.  CT chest, abdomen and pelvis in March 2022 did reveal any evidence of locally recurrent mass or lymphadenopathy. The multiple low-attenuation metastatic lesions of the liver were not significantly changed compared to prior examination.  ?   ?Unfortunately patient could not tolerate the side effects of bicalutamide including tiredness and fatigue and states that he was intermittently compliant with the medication after April 2022. Repeat PSA in June 2022 showed that his PSA had increased to 8.1. CT chest, abdomen and pelvis from August 2022 which showed interval increase in size of hypodense lesion in central right hepatic lobe measuring 2.7 X 1.7 cm. Other similar to slightly decreased size of multiple hypodense hepatic lesions were also noted. There was no evidence of osseous metastatic disease. Patient also had a nuclear medicine bone scan on the same day which showed a solitary focus of increased radiotracer activity involving the left fifth costochondral junction which per radiologist favors healed rib fracture rather than solitary metastatic disease. PSA was up to 32.8. At that time, the patient was referred to [facility] for a 2nd opinion and has continued treatment there. PSMA PET scan in November showed only activity in 2 liver lesions. He restarted ADT on November 23rd and started enzalutamide on November 25th, but did not tolerate it well and the PSA rose to 228 by late December. To bridge patient to PSMA treatment, re-treatment  with docetaxel has been recommended.  He continues Lupron every 6 months. He will be unable to receive the PSMA (Pluvicto) treatment through [facility] until April.  He is therefore receiving docetaxel/prednisone since February 6th. ? ?INTERVAL HISTORY:  ?Tayvon is a follow-up of his metastatic prostate cancer after being back on docetaxel and prednisone.  He has had severe back pain with the Neulasta injections.  We did an MRI and this showed severe degenerative changes but not metastatic cancer.  He says he is not feeling bad otherwise other than occasional muscle cramps.  However his PSA has increased from 32.9-2 383 and now 532.  I therefore did CT scans and he is here to review those results.  This reveals  multiple new and enlarged ill-defined hypodense lesions of the liver consistent with worsening hepatic metastases.  He has an unchanged soft tissue nodularity about the posterior right lobe of the liver involving th

## 2022-03-04 ENCOUNTER — Inpatient Hospital Stay: Payer: Medicare Other | Admitting: Hematology and Oncology

## 2022-03-04 ENCOUNTER — Other Ambulatory Visit: Payer: Medicare Other

## 2022-03-04 ENCOUNTER — Other Ambulatory Visit: Payer: Self-pay | Admitting: Oncology

## 2022-03-05 ENCOUNTER — Encounter: Payer: Self-pay | Admitting: Oncology

## 2022-03-05 ENCOUNTER — Ambulatory Visit: Payer: Medicare Other

## 2022-03-07 ENCOUNTER — Ambulatory Visit: Payer: Medicare Other | Admitting: Hematology and Oncology

## 2022-03-07 ENCOUNTER — Ambulatory Visit: Payer: Medicare Other

## 2022-03-07 ENCOUNTER — Other Ambulatory Visit: Payer: Medicare Other

## 2022-03-09 ENCOUNTER — Encounter: Payer: Self-pay | Admitting: Oncology

## 2022-03-14 ENCOUNTER — Other Ambulatory Visit: Payer: Medicare Other

## 2022-03-14 ENCOUNTER — Ambulatory Visit: Payer: Medicare Other | Admitting: Hematology and Oncology

## 2022-03-17 ENCOUNTER — Ambulatory Visit: Payer: Medicare Other | Admitting: Oncology

## 2022-03-17 ENCOUNTER — Other Ambulatory Visit: Payer: Medicare Other

## 2022-03-19 ENCOUNTER — Telehealth: Payer: Self-pay

## 2022-03-19 ENCOUNTER — Other Ambulatory Visit: Payer: Self-pay

## 2022-03-19 ENCOUNTER — Ambulatory Visit: Payer: Medicare Other

## 2022-03-19 DIAGNOSIS — R3989 Other symptoms and signs involving the genitourinary system: Secondary | ICD-10-CM

## 2022-03-19 DIAGNOSIS — M479 Spondylosis, unspecified: Secondary | ICD-10-CM

## 2022-03-19 DIAGNOSIS — G8929 Other chronic pain: Secondary | ICD-10-CM

## 2022-03-19 MED ORDER — OXYCODONE HCL 5 MG PO TABS: 5.0000 mg | ORAL_TABLET | ORAL | 0 refills | Status: DC | PRN

## 2022-03-19 NOTE — Telephone Encounter (Addendum)
RE: oxy ?Received: Yesterday ?Derwood Kaplan, MD  Dairl Ponder, RN ?Good, thank you, will see what happens  ? ?03/19/22 ?I called and spoke with Jeffrey Lambert and informed him of below. He states he is scheduled to get his first dose of Pluvicto on 04/04/22 @ [facility]. He also states he called [facility] first for oxycodone refill and they told him that we would need to fill it. Maybe, they told him that because he hasn't started treatment yet?! ? ? ?----- Message from Derwood Kaplan, MD sent at 03/19/2022  3:14 PM EDT ----- ?Regarding: oxy ?I refilled his oxycodone.  I see they are working on getting him the Pluvicto, that's great.  I am sure he will need regular visits and labs during that.  They may want that done there or will he need to get here?  If they are doing all of his care, they will need to refill the oxycodone in the future.  I don't have any problem and don't think he is overusing, but I can get in trouble if prescribing and not seeing him. ? ?

## 2022-03-20 ENCOUNTER — Encounter: Payer: Self-pay | Admitting: Oncology

## 2022-03-21 ENCOUNTER — Other Ambulatory Visit: Payer: Medicare Other

## 2022-03-25 ENCOUNTER — Other Ambulatory Visit: Payer: Medicare Other

## 2022-03-25 ENCOUNTER — Ambulatory Visit: Payer: Medicare Other | Admitting: Oncology

## 2022-03-26 ENCOUNTER — Ambulatory Visit: Payer: Medicare Other

## 2022-03-28 ENCOUNTER — Ambulatory Visit: Payer: Medicare Other

## 2022-04-04 ENCOUNTER — Other Ambulatory Visit: Payer: Self-pay | Admitting: Hematology and Oncology

## 2022-04-04 MED ORDER — TRAZODONE HCL 50 MG PO TABS: 50.0000 mg | ORAL_TABLET | Freq: Every day | ORAL | 0 refills | Status: DC

## 2022-05-20 ENCOUNTER — Other Ambulatory Visit: Payer: Self-pay | Admitting: Oncology

## 2023-01-08 ENCOUNTER — Other Ambulatory Visit: Payer: Self-pay | Admitting: Oncology

## 2023-01-08 DIAGNOSIS — C61 Malignant neoplasm of prostate: Secondary | ICD-10-CM

## 2023-01-08 NOTE — Telephone Encounter (Signed)
Have not seen since March 2023

## 2023-01-10 ENCOUNTER — Other Ambulatory Visit: Payer: Self-pay | Admitting: Oncology

## 2023-01-10 DIAGNOSIS — C61 Malignant neoplasm of prostate: Secondary | ICD-10-CM

## 2023-03-10 ENCOUNTER — Encounter: Payer: Self-pay | Admitting: Oncology

## 2023-03-10 ENCOUNTER — Other Ambulatory Visit: Payer: Self-pay | Admitting: Oncology

## 2023-03-10 DIAGNOSIS — C61 Malignant neoplasm of prostate: Secondary | ICD-10-CM

## 2023-03-12 ENCOUNTER — Other Ambulatory Visit: Payer: Self-pay | Admitting: Oncology

## 2023-03-25 NOTE — Progress Notes (Incomplete)
[facility]  [address] [place],  [place]  [address] [phone]  Clinic Day:  02/20/22  Referring physician: Verdell Face, DO  ASSESSMENT & PLAN:   Liver metastasis [facility]) Liver metastases diagnosed in June 2021, biopsy proven to be metastatic from his prostate cancer. He had a good response to12 cycles of docetaxel/prednisone, but was not complaint with maintenance bicalutamide. PSA increased from March 2022 to August 2022 up to 38.  He was then referred to [facility] for a 2nd opinion. PSMA PET scan in November showed only activity in 2 liver lesions He restarted ADT on November 23rd and started enzalutimide on November 25th, but did not tolerate it well and the PSA rose to 228 by late December. To bridge patient to PSMA treatment with Pluvicto, re-treatment with docetaxel/prednisone was recommended and carried out here.  This was started on February 6th, he has had severe pain with the Neulasta.  However his PSA has continued to increase, from 30 to to 383 and now the most recent is 532.  We have stopped the docetaxel.  His CT scan clearly shows progression of his liver metastases with multiple new and enlarged hypodense lesions.  We therefore discussed switching to Jevtana.  I contacted Dr. Hetty Ely and the Pluvicto will not be available for at least another month, so he does recommend proceeding with [facility] for now.  Back pain, lumbosacral He has had chronic lower back pain attributed to degenerative disease.  There is no evidence of metastatic cancer on imaging in August or November.  However, the pain has worsened and he is having low abdominal/bladder pain with an episode of right groin pain into the right thigh.  An MRI of the lumbar spine reveals severe degenerative disc disease and osteoarthritis but not malignancy.  I have reviewed these results with him.      I had planned start Jevtana next week.  He wishes to try it without the Neulasta  due to the severe pain that he gets from that.  He would need to take prednisone 10 mg twice daily with this.  However, I have now been notified that Dr. Thana Ates at [facility] may be able to get him started on the new therapy through [employer].  I will therefore put this treatment plan on hold.  We will be available for local oncology care as needed  The patient understands the plans discussed today and is in agreement with them.  He knows to contact our office if he develops concerns prior to his next appointment.     [name]  [facility] AT [facility] [address] [place] [address] Dept: [phone] Dept Fax: [phone]    CHIEF COMPLAINT:  CC: Abdominal pain and bladder pain  Current Treatment: Palliative docetaxel/prednisone  HISTORY OF PRESENT ILLNESS:  Jeffrey Lambert is a 67 y.o. 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  He had symptoms of urinary frequency and a steadily rising PSA.  Biopsy confirmed prostatic adenocarcinoma with a Gleason 8, 4+ 4, in 100% of the biopsy submitted.  He also had cystoscopy at that time, which was benign.  He has had hematuria and hematospermia in the past.  CT imaging in May revealed mild prostatic enlargement and a bone scan was negative for metastatic disease.  He underwent radical prostatectomy and bilateral pelvic lymphadenectomy in May 2019.  Pathology revealed ductal  carcinoma with a Gleason 9 (4+5) with lymphovascular invasion.  There was extraprostatic extension at bilateral apices and positive margins at the left posterior lateral wall.  There was also infiltration of the seminal vesicles.  The tumor volume was approximately 50% of the gland.  Bilateral lymph nodes were clear.  He had a weight loss of 80 lb in the perioperative period.  Due to his aggressive prostate cancer and family history of malignancy, he underwent  testing for hereditary cancer syndromes with the Myriad myRisk Hereditary Cancer Panel.  This did not reveal any clinically significant mutation or variants of uncertain significance.     He was recommended for adjuvant radiation therapy.  He received 2720 cGy for 15 fractions and then stopped in August 2019.  In January 2020, he came in for 1 treatment and in May 2020 he went back on daily treatment and had the remaining 5220 cGy in 29 fractions for a total cumulative dose of 7940 cGy. PET scan in July 2020 revealed mild hypermetabolic activity with soft tissue thickening in the deep pelvis felt to be postsurgical in nature.  There was no evidence of nodal metastasis or distant metastasis.  CT abdomen and pelvis done in the emergency room August 2020 revealed two hypodense liver lesions, new compared to imaging from 2019, ranging from 16-25 mm, which were suspicious for liver metastases, but were PET negative the month prior.  He continued Lupron and bicalutamide was added in March. The PSA responded.    CT abdomen and pelvis revealed interval progression of liver metastases with the largest measuring 3 cm. Bone scan was negative for metastatic disease, but revealed degenerative changes. Ultrasound guided biopsy of the liver in June revealed metastatic carcinoma consistent with the patient's known primary prostatic cancer. He continued his hormonal therapy and Taxotere was added.  He did receive 12 doses, completed in February 2022, with good response and the PSA going down from 26 to as low as 0.38. CT abdomen and pelvis in December revealed mild improvement in the hepatic metastasis with the largest lesion now measuring 1.7 cm, previously 1.9 cm.  CT chest, abdomen and pelvis in March 2022 did reveal any evidence of locally recurrent mass or lymphadenopathy. The multiple low-attenuation metastatic lesions of the liver were not significantly changed compared to prior examination.    Unfortunately patient  could not tolerate the side effects of bicalutamide including tiredness and fatigue and states that he was intermittently compliant with the medication after April 2022. Repeat PSA in June 2022 showed that his PSA had increased to 8.1. CT chest, abdomen and pelvis from August 2022 which showed interval increase in size of hypodense lesion in central right hepatic lobe measuring 2.7 X 1.7 cm. Other similar to slightly decreased size of multiple hypodense hepatic lesions were also noted. There was no evidence of osseous metastatic disease. Patient also had a nuclear medicine bone scan on the same day which showed a solitary focus of increased radiotracer activity involving the left fifth costochondral junction which per radiologist favors healed rib fracture rather than solitary metastatic disease. PSA was up to 32.8. At that time, the patient was referred to [facility] for a 2nd opinion and has continued treatment there. PSMA PET scan in November showed only activity in 2 liver lesions. He restarted ADT on November 23rd and started enzalutamide on November 25th, but did not tolerate it well and the PSA rose to 228 by late December. To bridge patient to PSMA treatment, re-treatment with docetaxel  has been recommended.  He continues Lupron every 6 months. He will be unable to receive the PSMA (Pluvicto) treatment through [facility] until April.  He is therefore receiving docetaxel/prednisone since February 6th.  INTERVAL HISTORY:  Jeffrey Lambert is a follow-up of his metastatic prostate cancer after being back on docetaxel and prednisone.         He has had severe back pain with the Neulasta injections.  We did an MRI and this showed severe degenerative changes but not metastatic cancer.  He says he is not feeling bad otherwise other than occasional muscle cramps.    However his PSA has increased from 32.9-2 383 and now 532.  I therefore did CT scans and he is here to review those results.  This reveals multiple  new and enlarged ill-defined hypodense lesions of the liver consistent with worsening hepatic metastases.  He has an unchanged soft tissue nodularity about the posterior right lobe of the liver involving the right 11th rib which is also likely soft tissue metastatic disease.  The small pulmonary nodules of the right lower lobe are unchanged and nonspecific, likely benign.  He has no evidence of bone metastases.  CBC reveals a hemoglobin of 13 with an MCV of 99 but normal white count and normal platelet count, comprehensive metabolic profile is normal. He denies fatigue.  He denies fevers or chills. His appetite is good and overall he feels well.  His weight is stable.  He denies pain.  Review of Systems  Constitutional: Negative.  Negative for appetite change, chills, diaphoresis, fatigue, fever and unexpected weight change.  HENT:  Negative.  Negative for hearing loss, lump/mass, mouth sores, nosebleeds, sore throat, tinnitus, trouble swallowing and voice change.   Eyes: Negative.  Negative for eye problems and icterus.  Respiratory: Negative.  Negative for chest tightness, cough, hemoptysis, shortness of breath and wheezing.   Cardiovascular: Negative.  Negative for chest pain, leg swelling and palpitations.  Gastrointestinal: Negative.  Negative for abdominal distention, abdominal pain, blood in stool, constipation (stool is soft difficult to excrete), diarrhea, nausea, rectal pain and vomiting.  Genitourinary:  Positive for bladder incontinence (chronic). Negative for difficulty urinating, dyspareunia, dysuria, frequency, hematuria, nocturia, pelvic pain (new) and penile discharge.   Musculoskeletal:  Positive for back pain. Negative for arthralgias, flank pain, gait problem, myalgias, neck pain and neck stiffness.  Skin: Negative.  Negative for itching, rash and wound.  Neurological: Negative.  Negative for dizziness, extremity weakness, gait problem, headaches, light-headedness, numbness, seizures  and speech difficulty.  Hematological: Negative.  Negative for adenopathy. Does not bruise/bleed easily.  Psychiatric/Behavioral: Negative.  Negative for confusion, decreased concentration, depression, sleep disturbance and suicidal ideas. The patient is not nervous/anxious.      VITALS:  There were no vitals taken for this visit.  Wt Readings from Last 3 Encounters:  02/20/22 133 lb (60.3 kg)  02/05/22 132 lb (59.9 kg)  02/03/22 133 lb 3 oz (60.4 kg)    There is no height or weight on file to calculate BMI.  Performance status (ECOG): 1 - Symptomatic but completely ambulatory  PHYSICAL EXAM:  Physical Exam Vitals and nursing note reviewed.  Constitutional:      General: He is not in acute distress.    Appearance: Normal appearance. He is normal weight. He is not ill-appearing, toxic-appearing or diaphoretic.  HENT:     Head: Normocephalic and atraumatic.     Right Ear: Tympanic membrane, ear canal and external ear normal. There is no impacted cerumen.  Left Ear: Tympanic membrane, ear canal and external ear normal. There is no impacted cerumen.     Nose: Nose normal. No congestion or rhinorrhea.     Mouth/Throat:     Mouth: Mucous membranes are moist.     Pharynx: Oropharynx is clear. No oropharyngeal exudate or posterior oropharyngeal erythema.  Eyes:     General: No scleral icterus.       Right eye: No discharge.        Left eye: No discharge.     Extraocular Movements: Extraocular movements intact.     Conjunctiva/sclera: Conjunctivae normal.     Pupils: Pupils are equal, round, and reactive to light.  Neck:     Vascular: No carotid bruit.  Cardiovascular:     Rate and Rhythm: Normal rate and regular rhythm.     Pulses: Normal pulses.     Heart sounds: Normal heart sounds. No murmur heard.    No friction rub. No gallop.  Pulmonary:     Effort: Pulmonary effort is normal. No respiratory distress.     Breath sounds: Normal breath sounds. No stridor. No wheezing,  rhonchi or rales.  Chest:     Chest wall: No tenderness.  Abdominal:     General: Bowel sounds are normal. There is no distension.     Palpations: Abdomen is soft. There is no hepatomegaly, splenomegaly or mass.     Tenderness: There is abdominal tenderness (right groin). There is no right CVA tenderness, left CVA tenderness, guarding or rebound.     Hernia: No hernia is present.  Musculoskeletal:        General: No swelling, tenderness, deformity or signs of injury. Normal range of motion.     Cervical back: Normal range of motion and neck supple. No rigidity or tenderness.     Right lower leg: No edema.     Left lower leg: No edema.  Lymphadenopathy:     Cervical: No cervical adenopathy.     Upper Body:     Right upper body: No supraclavicular or axillary adenopathy.     Left upper body: No supraclavicular or axillary adenopathy.     Lower Body: No right inguinal adenopathy. No left inguinal adenopathy.  Skin:    General: Skin is warm and dry.     Coloration: Skin is not jaundiced or pale.     Findings: No bruising, erythema, lesion or rash.  Neurological:     General: No focal deficit present.     Mental Status: He is alert and oriented to person, place, and time. Mental status is at baseline.     Cranial Nerves: No cranial nerve deficit.     Sensory: No sensory deficit.     Motor: No weakness.     Coordination: Coordination normal.     Gait: Gait normal.     Deep Tendon Reflexes: Reflexes normal.  Psychiatric:        Mood and Affect: Mood normal.        Behavior: Behavior normal.        Thought Content: Thought content normal.        Judgment: Judgment normal.     LABS:   CBC 02/04/2023  WBC 4.4 - 11.0 x 10*3/uL 5.3  RBC 4.50 - 5.90 x 10*6/uL 3.16 Low   Hemoglobin 14.0 - 17.5 G/DL 16.1 Low   Hematocrit 09.6 - 50.4 % 30.9 Low   MCV 80.0 - 96.0 FL 97.8 High   MCH 27.5 - 33.2 PG 34.2  High   MCHC 33.0 - 37.0 G/DL 40.9  RDW 81.1 - 91.4 % 12.7  Platelets 150  - 450 X 10*3/uL 231   CMP 02/04/2023  Sodium 135 - 146 MMOL/L 137  Potassium 3.5 - 5.3 MMOL/L 4.1  Chloride 98 - 110 MMOL/L 106  CO2 21 - 31 MMOL/L 25  BUN 8 - 24 MG/DL 17  Glucose 70 - 99 MG/DL 782 High   Creatinine 9.56 - 1.30 MG/DL 2.13  Calcium 8.5 - 08.6 MG/DL 8.3 Low   Total Protein 6.4 - 8.9 G/DL 6.1 Low   Albumin 3.5 - 5.7 G/DL 3.7  Total Bilirubin 0.0 - 1.0 MG/DL 0.3  Alkaline Phosphatase 34 - 104 IU/L or U/L 49  AST (SGOT) 13 - 39 IU/L or U/L 25  ALT (SGPT) 7 - 52 IU/L or U/L 26  Anion Gap 4 - 14 MMOL/L 6   Component Ref Range & Units 02/04/2023  PSA 0.00 - 4.50 NG/ML 87.99 High       Latest Ref Rng & Units 02/18/2022   12:00 AM 01/30/2022   12:00 AM 01/10/2022   12:00 AM  CBC  WBC  8.2     6.1  7.5      Hemoglobin 13.5 - 17.5 13.0     12.7  13.8      Hematocrit 41 - 53 40     37  40      Platelets 150 - 400 K/uL 217     323  281         This result is from an external source.       Latest Ref Rng & Units 02/18/2022   12:00 AM 01/30/2022   12:00 AM 01/10/2022   12:00 AM  CMP  BUN 4 - 21 16     15  19       Creatinine 0.6 - 1.3 0.8     0.7  0.8      Sodium 137 - 147 139     138  137      Potassium 3.5 - 5.1 mEq/L 4.0     4.2  3.5      Chloride 99 - 108 102     107  104      CO2 13 - 22 30     24  29       Calcium 8.7 - 10.7 9.3     8.3  8.7      Alkaline Phos 25 - 125 81     65  71      AST 14 - 40 39     42  46      ALT 10 - 40 U/L 43     40  42         This result is from an external source.    No results found for: "CEA1", "CEA" / No results found for: "CEA1", "CEA" Lab Results  Component Value Date   PSA1 532.0 (H) 01/30/2022   STUDIES:  EXAM: 02/18/22 CT CHEST, ABDOMEN, AND PELVIS WITH CONTRAST  IMPRESSION:  1. Multiple new and enlarged, somewhat ill-defined centrally  hypodense lesions of the liver, consistent with worsened hepatic  metastatic disease.  2. Unchanged soft tissue nodularity about the posterior right lobe  of the  liver, involving the right eleventh twelfth intercostal  space, suspicious for additional site of soft tissue metastatic  disease.  3. Unchanged small pulmonary nodules of the right lower lobe,  nonspecific, most likely  incidental and infectious or inflammatory  given long-term stability. Attention on follow-up.  4. No CT evidence of osseous metastatic disease.  5. Status post prostatectomy.  6. Coronary artery disease.  Aortic Atherosclerosis (ICD10-I70.0).   Electronically Signed  By: Jearld Lesch M.D.  On: 02/19/2022 09:29    HISTORY:   Past Medical History:  Diagnosis Date   Allergy    Back pain, lumbosacral 01/10/2022   Depression    Dupuytren's contracture    GERD (gastroesophageal reflux disease)    Liver metastasis (HCC) 01/10/2022   Malignant neoplasm of prostate (HCC)    Osteoarthritis    Secondary malignant neoplasm of liver (HCC)    Secondary malignant neoplasm of liver [facility])     Past Surgical History:  Procedure Laterality Date   APPENDECTOMY  1970   HERNIA REPAIR  03/2020   PROSTATECTOMY  2019    Family History  Problem Relation Age of Onset   Colon cancer Brother 52   Cancer Maternal Uncle    Colon cancer Maternal Grandfather 39    Social History:  reports that he has quit smoking. He has never used smokeless tobacco. He reports that he does not drink alcohol. No history on file for drug use.The patient is alone today.  Allergies:  Allergies  Allergen Reactions   Grass Pollen([id number] Vern)     Other reaction(s): Cough (ALLERGY/intolerance)   Other Other (See Comments)    Grass Pollen  Other reaction(s): Other (See Comments) Sneezing, runny nose    Current Medications: Current Outpatient Medications  Medication Sig Dispense Refill   loratadine (CLARITIN) 10 MG tablet Take by mouth.     ondansetron (ZOFRAN) 4 MG tablet Take 4 mg by mouth every 8 (eight) hours as needed for nausea or vomiting.     oxyCODONE (OXY IR/ROXICODONE) 5 MG  immediate release tablet Take 1 tablet (5 mg total) by mouth every 4 (four) hours as needed for severe pain. 100 tablet 0   pantoprazole (PROTONIX) 40 MG tablet pantoprazole 40 mg tablet,delayed release  TAKE 1 TABLET BY MOUTH TWICE DAILY WITH BREAKFAST AND EVENING MEAL     predniSONE (DELTASONE) 10 MG tablet Take 1 tablet (10 mg total) by mouth 2 (two) times daily with a meal. 60 tablet 5   PROAIR HFA 108 (90 Base) MCG/ACT inhaler Inhale 2 puffs into the lungs every 6 (six) hours as needed for shortness of breath. 1 each 5   prochlorperazine (COMPAZINE) 10 MG tablet Take 10 mg by mouth every 6 (six) hours as needed for nausea or vomiting.     traZODone (DESYREL) 50 MG tablet Take 1 tablet (50 mg total) by mouth at bedtime. 30 tablet 0   No current facility-administered medications for this visit.     I,Jasmine M Lassiter,acting as a scribe for Dellia Beckwith, MD.,have documented all relevant documentation on the behalf of Dellia Beckwith, MD,as directed by  Dellia Beckwith, MD while in the presence of Dellia Beckwith, MD.

## 2023-03-26 ENCOUNTER — Inpatient Hospital Stay: Payer: 59 | Admitting: Oncology

## 2023-03-26 ENCOUNTER — Inpatient Hospital Stay: Payer: 59

## 2023-03-26 DIAGNOSIS — C61 Malignant neoplasm of prostate: Secondary | ICD-10-CM

## 2023-03-26 DIAGNOSIS — C787 Secondary malignant neoplasm of liver and intrahepatic bile duct: Secondary | ICD-10-CM

## 2023-03-26 NOTE — Addendum Note (Signed)
Addended by: Domenic Schwab on: 03/26/2023 09:58 AM   Modules accepted: Orders

## 2024-01-09 DEATH — deceased
